# Patient Record
Sex: Female | Born: 1945 | Race: Black or African American | Hispanic: No | Marital: Married | Smoking: Former smoker
Health system: Southern US, Community
[De-identification: ages and names within clinical notes are randomized; demographics above are authoritative.]

## PROBLEM LIST (undated history)

## (undated) DIAGNOSIS — I10 Essential (primary) hypertension: Secondary | ICD-10-CM

## (undated) DIAGNOSIS — E119 Type 2 diabetes mellitus without complications: Secondary | ICD-10-CM

## (undated) HISTORY — PX: CHOLECYSTECTOMY: SHX55

## (undated) HISTORY — PX: STENT PLACEMENT VASCULAR (ARMC HX): HXRAD1737

## (undated) HISTORY — PX: WHIPPLE PROCEDURE: SHX2667

---

## 2019-05-10 ENCOUNTER — Emergency Department: Payer: Medicare Other

## 2019-05-10 ENCOUNTER — Other Ambulatory Visit: Payer: Self-pay

## 2019-05-10 ENCOUNTER — Observation Stay
Admission: EM | Admit: 2019-05-10 | Discharge: 2019-05-11 | Disposition: A | Discharge status: Home / Self Care | Payer: Medicare Other | Attending: Internal Medicine | Admitting: Internal Medicine

## 2019-05-10 ENCOUNTER — Encounter: Payer: Self-pay | Admitting: Emergency Medicine

## 2019-05-10 DIAGNOSIS — Z23 Encounter for immunization: Secondary | ICD-10-CM | POA: Diagnosis not present

## 2019-05-10 DIAGNOSIS — I10 Essential (primary) hypertension: Secondary | ICD-10-CM | POA: Diagnosis not present

## 2019-05-10 DIAGNOSIS — I6523 Occlusion and stenosis of bilateral carotid arteries: Secondary | ICD-10-CM | POA: Insufficient documentation

## 2019-05-10 DIAGNOSIS — E119 Type 2 diabetes mellitus without complications: Secondary | ICD-10-CM

## 2019-05-10 DIAGNOSIS — W19XXXA Unspecified fall, initial encounter: Secondary | ICD-10-CM | POA: Diagnosis not present

## 2019-05-10 DIAGNOSIS — I350 Nonrheumatic aortic (valve) stenosis: Secondary | ICD-10-CM | POA: Diagnosis not present

## 2019-05-10 DIAGNOSIS — E785 Hyperlipidemia, unspecified: Secondary | ICD-10-CM | POA: Diagnosis not present

## 2019-05-10 DIAGNOSIS — K219 Gastro-esophageal reflux disease without esophagitis: Secondary | ICD-10-CM | POA: Insufficient documentation

## 2019-05-10 DIAGNOSIS — Z1159 Encounter for screening for other viral diseases: Secondary | ICD-10-CM | POA: Insufficient documentation

## 2019-05-10 DIAGNOSIS — I351 Nonrheumatic aortic (valve) insufficiency: Secondary | ICD-10-CM | POA: Insufficient documentation

## 2019-05-10 DIAGNOSIS — Z87891 Personal history of nicotine dependence: Secondary | ICD-10-CM | POA: Diagnosis not present

## 2019-05-10 DIAGNOSIS — S01112A Laceration without foreign body of left eyelid and periocular area, initial encounter: Secondary | ICD-10-CM | POA: Diagnosis present

## 2019-05-10 DIAGNOSIS — I951 Orthostatic hypotension: Secondary | ICD-10-CM | POA: Diagnosis not present

## 2019-05-10 DIAGNOSIS — R55 Syncope and collapse: Secondary | ICD-10-CM | POA: Insufficient documentation

## 2019-05-10 HISTORY — DX: Type 2 diabetes mellitus without complications: E11.9

## 2019-05-10 HISTORY — DX: Essential (primary) hypertension: I10

## 2019-05-10 LAB — BASIC METABOLIC PANEL
Anion gap: 10 (ref 5–15)
BUN: 11 mg/dL (ref 8–23)
CO2: 23 mmol/L (ref 22–32)
Calcium: 9.2 mg/dL (ref 8.9–10.3)
Chloride: 97 mmol/L — ABNORMAL LOW (ref 98–111)
Creatinine, Ser: 1.12 mg/dL — ABNORMAL HIGH (ref 0.44–1.00)
GFR calc Af Amer: 56 mL/min — ABNORMAL LOW (ref >60)
GFR calc non Af Amer: 48 mL/min — ABNORMAL LOW (ref >60)
Glucose, Bld: 129 mg/dL — ABNORMAL HIGH (ref 70–99)
Potassium: 4.2 mmol/L (ref 3.5–5.1)
Sodium: 130 mmol/L — ABNORMAL LOW (ref 135–145)

## 2019-05-10 LAB — URINALYSIS, COMPLETE (UACMP) WITH MICROSCOPIC
Bacteria, UA: NONE SEEN
Bilirubin Urine: NEGATIVE
Glucose, UA: NEGATIVE mg/dL
Hgb urine dipstick: NEGATIVE
Ketones, ur: NEGATIVE mg/dL
Leukocytes,Ua: NEGATIVE
Nitrite: NEGATIVE
Protein, ur: NEGATIVE mg/dL
Specific Gravity, Urine: 1.003 — ABNORMAL LOW (ref 1.005–1.030)
Squamous Epithelial / HPF: NONE SEEN (ref 0–5)
pH: 6 (ref 5.0–8.0)

## 2019-05-10 LAB — CBC
HCT: 31.8 % — ABNORMAL LOW (ref 36.0–46.0)
Hemoglobin: 11 g/dL — ABNORMAL LOW (ref 12.0–15.0)
MCH: 28.4 pg (ref 26.0–34.0)
MCHC: 34.6 g/dL (ref 30.0–36.0)
MCV: 82 fL (ref 80.0–100.0)
Platelets: 260 10*3/uL (ref 150–400)
RBC: 3.88 MIL/uL (ref 3.87–5.11)
RDW: 14.6 % (ref 11.5–15.5)
WBC: 6.8 10*3/uL (ref 4.0–10.5)
nRBC: 0 % (ref 0.0–0.2)

## 2019-05-10 LAB — TROPONIN I (HIGH SENSITIVITY)
Troponin I (High Sensitivity): 4 ng/L (ref <18)
Troponin I (High Sensitivity): 6 ng/L (ref <18)

## 2019-05-10 LAB — SARS CORONAVIRUS 2 BY RT PCR (HOSPITAL ORDER, PERFORMED IN ~~LOC~~ HOSPITAL LAB): SARS Coronavirus 2: NEGATIVE

## 2019-05-10 LAB — GLUCOSE, CAPILLARY: Glucose-Capillary: 105 mg/dL — ABNORMAL HIGH (ref 70–99)

## 2019-05-10 MED ORDER — ONDANSETRON HCL 4 MG/2ML IJ SOLN: 4.0000 mg | Freq: Four times a day (QID) | INTRAMUSCULAR | Status: DC | PRN

## 2019-05-10 MED ORDER — INSULIN ASPART 100 UNIT/ML ~~LOC~~ SOLN: 0.0000 [IU] | Freq: Three times a day (TID) | SUBCUTANEOUS | Status: DC

## 2019-05-10 MED ORDER — LIDOCAINE-EPINEPHRINE-TETRACAINE (LET) SOLUTION
3.0000 mL | Freq: Once | NASAL | Status: AC
  Administered 2019-05-10: 3 mL via TOPICAL
  Filled 2019-05-10: qty 3

## 2019-05-10 MED ORDER — SODIUM CHLORIDE 0.9 % IV BOLUS
500.0000 mL | Freq: Once | INTRAVENOUS | Status: AC
  Administered 2019-05-10: 500 mL via INTRAVENOUS

## 2019-05-10 MED ORDER — ACETAMINOPHEN 325 MG PO TABS: 650.0000 mg | ORAL_TABLET | Freq: Four times a day (QID) | ORAL | Status: DC | PRN

## 2019-05-10 MED ORDER — TETANUS-DIPHTH-ACELL PERTUSSIS 5-2.5-18.5 LF-MCG/0.5 IM SUSP
0.5000 mL | Freq: Once | INTRAMUSCULAR | Status: AC
  Administered 2019-05-10: 0.5 mL via INTRAMUSCULAR
  Filled 2019-05-10: qty 0.5

## 2019-05-10 MED ORDER — SODIUM CHLORIDE 0.9 % IV SOLN
INTRAVENOUS | Status: AC
  Administered 2019-05-10: 23:00:00 via INTRAVENOUS

## 2019-05-10 MED ORDER — ONDANSETRON HCL 4 MG PO TABS: 4.0000 mg | ORAL_TABLET | Freq: Four times a day (QID) | ORAL | Status: DC | PRN

## 2019-05-10 MED ORDER — ENOXAPARIN SODIUM 40 MG/0.4ML ~~LOC~~ SOLN
40.0000 mg | SUBCUTANEOUS | Status: DC
  Administered 2019-05-10: 40 mg via SUBCUTANEOUS
  Filled 2019-05-10: qty 0.4

## 2019-05-10 MED ORDER — ACETAMINOPHEN 650 MG RE SUPP: 650.0000 mg | Freq: Four times a day (QID) | RECTAL | Status: DC | PRN

## 2019-05-10 NOTE — ED Notes (Signed)
Patient transported to CT 

## 2019-05-10 NOTE — ED Notes (Signed)
ED TO INPATIENT HANDOFF REPORT  ED Nurse Name and Phone #: Berline Lopesgracie [phone]  S Name/Age/Gender Jenna Robertson 73 y.o. female Room/Bed: ED25A/ED25A  Code Status   Code Status: Not on file  Home/SNF/Other Home Patient oriented to: self, place, time and situation Is this baseline? Yes   Triage Complete: Triage complete  Chief Complaint Fall  Triage Note Pt arrives with concerns over 2 possible syncopal episodes today. Pt arrives with laceration to the left of her eyelid. Pt states she doesn't remember what caused her to fall but states she woke up on the floor.    Allergies Allergies  Allergen Reactions  . Hydromorphone Itching and Hives  . Contrast Media  [Iodinated Diagnostic Agents] Rash and Hives  . Iopamidol Rash and Hives    Level of Care/Admitting Diagnosis ED Disposition    ED Disposition Condition Comment   Admit  Hospital Area: [facility] [[id number]]  Level of Care: Telemetry [5]  Covid Evaluation: Confirmed COVID Negative  Diagnosis: Recurrent syncope [[id number]]  Admitting Physician: Oralia ManisWILLIS, DAVID [[id number]]  Attending Physician: Oralia ManisWILLIS, DAVID [[id number]]  Bed request comments: 2a  PT Class (Do Not Modify): Observation [104]  PT Acc Code (Do Not Modify): Observation [10022]       B Medical/Surgery History Past Medical History:  Diagnosis Date  . Diabetes mellitus without complication (HCC)   . Hypertension    Past Surgical History:  Procedure Laterality Date  . CHOLECYSTECTOMY    . STENT PLACEMENT VASCULAR ([facility] HX)    . WHIPPLE PROCEDURE       A IV Location/Drains/Wounds Patient Lines/Drains/Airways Status   Active Line/Drains/Airways    Name:   Placement date:   Placement time:   Site:   Days:   Peripheral IV 05/10/19 Left Antecubital   05/10/19    2042    Antecubital   less than 1          Intake/Output Last 24 hours No intake or output data in the 24 hours ending 05/10/19 2231  Labs/Imaging Results for orders placed  or performed during the hospital encounter of 05/10/19 (from the past 48 hour(s))  Basic metabolic panel     Status: Abnormal   Collection Time: 05/10/19  6:29 PM  Result Value Ref Range   Sodium 130 (L) 135 - 145 mmol/L   Potassium 4.2 3.5 - 5.1 mmol/L   Chloride 97 (L) 98 - 111 mmol/L   CO2 23 22 - 32 mmol/L   Glucose, Bld 129 (H) 70 - 99 mg/dL   BUN 11 8 - 23 mg/dL   Creatinine, Ser 8.651.12 (H) 0.44 - 1.00 mg/dL   Calcium 9.2 8.9 - 78.410.3 mg/dL   GFR calc non Af Amer 48 (L) >60 mL/min   GFR calc Af Amer 56 (L) >60 mL/min   Anion gap 10 5 - 15    Comment: Performed at [facility], [address] [place], [place] [address]  CBC     Status: Abnormal   Collection Time: 05/10/19  6:29 PM  Result Value Ref Range   WBC 6.8 4.0 - 10.5 K/uL   RBC 3.88 3.87 - 5.11 MIL/uL   Hemoglobin 11.0 (L) 12.0 - 15.0 g/dL   HCT 52.831.8 (L) 41.336.0 - 24.446.0 %   MCV 82.0 80.0 - 100.0 fL   MCH 28.4 26.0 - 34.0 pg   MCHC 34.6 30.0 - 36.0 g/dL   RDW 01.014.6 27.211.5 - 53.615.5 %   Platelets 260 150 - 400 K/uL  nRBC 0.0 0.0 - 0.2 %    Comment: Performed at [facility], [place], [place] [address]  Troponin I (High Sensitivity)     Status: None   Collection Time: 05/10/19  6:29 PM  Result Value Ref Range   Troponin I (High Sensitivity) 4 <18 ng/L    Comment: (NOTE) Elevated high sensitivity troponin I (hsTnI) values and significant  changes across serial measurements may suggest ACS but many other  chronic and acute conditions are known to elevate hsTnI results.  Refer to the "Links" section for chest pain algorithms and additional  guidance. Performed at [facility], [place], [place] [address]   Glucose, capillary     Status: Abnormal   Collection Time: 05/10/19  6:32 PM  Result Value Ref Range   Glucose-Capillary 105 (H) 70 - 99 mg/dL  Troponin I (High Sensitivity)     Status: None   Collection Time: 05/10/19  8:38 PM  Result Value Ref Range    Troponin I (High Sensitivity) 6 <18 ng/L    Comment: (NOTE) Elevated high sensitivity troponin I (hsTnI) values and significant  changes across serial measurements may suggest ACS but many other  chronic and acute conditions are known to elevate hsTnI results.  Refer to the "Links" section for chest pain algorithms and additional  guidance. Performed at [facility], [address] [place], [place] [address]   SARS Coronavirus 2 (CEPHEID - Performed in [facility] hospital lab), Hosp Order     Status: None   Collection Time: 05/10/19  8:53 PM   Specimen: Nasopharyngeal Swab  Result Value Ref Range   SARS Coronavirus 2 NEGATIVE NEGATIVE    Comment: (NOTE) If result is NEGATIVE SARS-CoV-2 target nucleic acids are NOT DETECTED. The SARS-CoV-2 RNA is generally detectable in upper and lower  respiratory specimens during the acute phase of infection. The lowest  concentration of SARS-CoV-2 viral copies this assay can detect is 250  copies / mL. A negative result does not preclude SARS-CoV-2 infection  and should not be used as the sole basis for treatment or other  patient management decisions.  A negative result may occur with  improper specimen collection / handling, submission of specimen other  than nasopharyngeal swab, presence of viral mutation(s) within the  areas targeted by this assay, and inadequate number of viral copies  (<250 copies / mL). A negative result must be combined with clinical  observations, patient history, and epidemiological information. If result is POSITIVE SARS-CoV-2 target nucleic acids are DETECTED. The SARS-CoV-2 RNA is generally detectable in upper and lower  respiratory specimens dur ing the acute phase of infection.  Positive  results are indicative of active infection with SARS-CoV-2.  Clinical  correlation with patient history and other diagnostic information is  necessary to determine patient infection status.  Positive results do  not  rule out bacterial infection or co-infection with other viruses. If result is PRESUMPTIVE POSTIVE SARS-CoV-2 nucleic acids MAY BE PRESENT.   A presumptive positive result was obtained on the submitted specimen  and confirmed on repeat testing.  While 2019 novel coronavirus  (SARS-CoV-2) nucleic acids may be present in the submitted sample  additional confirmatory testing may be necessary for epidemiological  and / or clinical management purposes  to differentiate between  SARS-CoV-2 and other Sarbecovirus currently known to infect humans.  If clinically indicated additional testing with an alternate test  methodology [phone]) is advised. The SARS-CoV-2 RNA is generally  detectable in upper and lower respiratory sp ecimens during the acute  phase of infection. The expected result is Negative. Fact Sheet for Patients:  BoilerBrush.com.cyhttps://www.fda.gov/media/136312/download Fact Sheet for Healthcare Providers: https://pope.com/https://www.fda.gov/media/136313/download This test is not yet approved or cleared by the [place] FDA and has been authorized for detection and/or diagnosis of SARS-CoV-2 by FDA under an Emergency Use Authorization (EUA).  This EUA will remain in effect (meaning this test can be used) for the duration of the COVID-19 declaration under Section 564(b)(1) of the Act, 21 U.S.C. section 360bbb-3(b)(1), unless the authorization is terminated or revoked sooner. Performed at [facility], [address] [place], [place] [address]   Urinalysis, Complete w Microscopic     Status: Abnormal   Collection Time: 05/10/19 10:09 PM  Result Value Ref Range   Color, Urine STRAW (A) YELLOW   APPearance CLEAR (A) CLEAR   Specific Gravity, Urine 1.003 (L) 1.005 - 1.030   pH 6.0 5.0 - 8.0   Glucose, UA NEGATIVE NEGATIVE mg/dL   Hgb urine dipstick NEGATIVE NEGATIVE   Bilirubin Urine NEGATIVE NEGATIVE   Ketones, ur NEGATIVE NEGATIVE mg/dL   Protein, ur NEGATIVE NEGATIVE mg/dL   Nitrite  NEGATIVE NEGATIVE   Leukocytes,Ua NEGATIVE NEGATIVE   RBC / HPF 0-5 0 - 5 RBC/hpf   WBC, UA 0-5 0 - 5 WBC/hpf   Bacteria, UA NONE SEEN NONE SEEN   Squamous Epithelial / LPF NONE SEEN 0 - 5    Comment: Performed at [facility], [address] [place], [place] [address]   Ct Head Wo Contrast  Result Date: 05/10/2019 CLINICAL DATA:  Head trauma EXAM: CT HEAD WITHOUT CONTRAST TECHNIQUE: Contiguous axial images were obtained from the base of the skull through the vertex without intravenous contrast. COMPARISON:  None. FINDINGS: Brain: No evidence of acute infarction, hemorrhage, hydrocephalus, extra-axial collection or mass lesion/mass effect. Mild periventricular white matter hypodensity. Vascular: No hyperdense vessel or unexpected calcification. Skull: Normal. Negative for fracture or focal lesion. Sinuses/Orbits: No acute finding. Other: None. IMPRESSION: No acute intracranial pathology.  Small-vessel white matter disease. Electronically Signed   By: Lauralyn PrimesAlex  Bibbey M.D.   On: 05/10/2019 18:49   Ct Cervical Spine Wo Contrast  Result Date: 05/10/2019 CLINICAL DATA:  Fall.  Syncope. EXAM: CT CERVICAL SPINE WITHOUT CONTRAST TECHNIQUE: Multidetector CT imaging of the cervical spine was performed without intravenous contrast. Multiplanar CT image reconstructions were also generated. COMPARISON:  None. FINDINGS: Alignment: Mild anterolisthesis C7-T1 Skull base and vertebrae: Negative for cervical spine fracture Soft tissues and spinal canal: Negative for soft tissue mass or edema. Disc levels: Moderate disc degeneration and spurring throughout the cervical spine. Prominent right-sided osteophyte at C3-4 causing spinal stenosis. Mild spinal stenosis C4-5 and C5-6 and C6-7. Upper chest: Lung apices clear bilaterally Other: None IMPRESSION: Negative for cervical spine fracture. Moderate cervical spondylosis. Electronically Signed   By: Marlan Palauharles  Clark M.D.   On: 05/10/2019 20:54   Dg Chest Portable 1  View  Result Date: 05/10/2019 CLINICAL DATA:  Syncope. EXAM: PORTABLE CHEST 1 VIEW COMPARISON:  None. FINDINGS: The heart size is mildly enlarged. There are aortic calcifications. There is no pneumothorax. No large pleural effusion. There are prominent interstitial lung markings. There is no large pleural effusion. IMPRESSION: 1. Mild cardiac enlargement. 2. Prominent interstitial lung markings favored to represent mild volume overload on a background of emphysematous changes. Electronically Signed   By: Katherine Mantlehristopher  Green M.D.   On: 05/10/2019 20:37    Pending Labs [employer] Labs (  From admission, onward)    Start     Ordered   Signed and Held  CBC  (enoxaparin (LOVENOX)    CrCl >/= 30 ml/min)  Once,   R    Comments: Baseline for enoxaparin therapy IF NOT ALREADY DRAWN.  Notify MD if PLT < 100 K.    Signed and Held   Signed and Held  Creatinine, serum  (enoxaparin (LOVENOX)    CrCl >/= 30 ml/min)  Once,   R    Comments: Baseline for enoxaparin therapy IF NOT ALREADY DRAWN.    Signed and Held   Signed and Held  Creatinine, serum  (enoxaparin (LOVENOX)    CrCl >/= 30 ml/min)  Weekly,   R    Comments: while on enoxaparin therapy    Signed and Held   Signed and Held  Basic metabolic panel  Tomorrow morning,   R     Signed and Held   Signed and Held  CBC  Tomorrow morning,   R     Signed and Held          Vitals/Pain Today's Vitals   05/10/19 2030 05/10/19 2100 05/10/19 2130 05/10/19 2200  BP: 140/78 137/82 129/78 133/76  Pulse: 99 95 94 87  Resp: 18 13 (!) 22 14  Temp:      TempSrc:      SpO2: 99% 100% 100% 100%  Weight:      Height:      PainSc:        Isolation Precautions No active isolations  Medications Medications  lidocaine-EPINEPHrine-tetracaine (LET) solution (3 mLs Topical Given by Other 05/10/19 2131)  Tdap (BOOSTRIX) injection 0.5 mL (0.5 mLs Intramuscular Given 05/10/19 2022)  sodium chloride 0.9 % bolus 500 mL (500 mLs Intravenous Bolus 05/10/19 2219)     Mobility walks Moderate fall risk   Focused Assessments Neuro Assessment Handoff:  Swallow screen pass? n/a         Neuro Assessment: Within Defined Limits Neuro Checks:      Last Documented NIHSS Modified Score:   Has TPA been given? No If patient is a Neuro Trauma and patient is going to OR before floor call report to 4N Charge nurse: [phone] or [phone]     R Recommendations: See Admitting Provider Note  Report given to:   Additional Notes:

## 2019-05-10 NOTE — ED Notes (Signed)
pts son updated at this time

## 2019-05-10 NOTE — ED Triage Notes (Signed)
Pt arrives with concerns over 2 possible syncopal episodes today. Pt arrives with laceration to the left of her eyelid. Pt states she doesn't remember what caused her to fall but states she woke up on the floor.

## 2019-05-10 NOTE — ED Notes (Signed)
Pt given crackers and peanut butter and diet cole at this time. Pt ambulated to bathroom with assistance, minimal dizziness. Pt placed top dentures in pink cup, placed in pts black purse at bedside.

## 2019-05-10 NOTE — ED Provider Notes (Signed)
[facility] Emergency Department Provider Note    First MD Initiated Contact with Patient 05/10/19 2003     (approximate)  I have reviewed the triage vital signs and the nursing notes.   HISTORY  Chief Complaint Loss of Consciousness    HPI Jenna Robertson is a 73 y.o. female presents the ER for evaluation of head injury as well as multiple falls and syncopal events over the past several days.  Patient is amnestic to the events.  States that she wakes up on the floor.  States she had 2 falls today.  Denies any palpitations chest pain or flashing lights before.  Denies any shortness of breath.  Denies any history of syncope or fainting spells.  Denies any neck pain no numbness or tingling.    Past Medical History:  Diagnosis Date  . Diabetes mellitus without complication (HCC)   . Hypertension    No family history on file.  There are no active problems to display for this patient.     Prior to Admission medications   Not on File    Allergies Patient has no allergy information on record.    Social History Social History   Tobacco Use  . Smoking status: Not on file  Substance Use Topics  . Alcohol use: Not on file  . Drug use: Not on file    Review of Systems Patient denies headaches, rhinorrhea, blurry vision, numbness, shortness of breath, chest pain, edema, cough, abdominal pain, nausea, vomiting, diarrhea, dysuria, fevers, rashes or hallucinations unless otherwise stated above in HPI. ____________________________________________   PHYSICAL EXAM:  VITAL SIGNS: Vitals:   05/10/19 1825  BP: 131/75  Pulse: (!) 113  Resp: 16  Temp: 98.8 F (37.1 C)  SpO2: 98%    Constitutional: Alert and oriented.  Eyes: Conjunctivae are normal. EOMI Head: 1cm left eyebrow laceration with associated swelling, no proptosis Nose: No congestion/rhinnorhea. Mouth/Throat: Mucous membranes are moist.   Neck: No stridor. Painless ROM.   Cardiovascular: Normal rate, regular rhythm. Grossly normal heart sounds.  Good peripheral circulation. Respiratory: Normal respiratory effort.  No retractions. Lungs CTAB. Gastrointestinal: Soft and nontender. No distention. No abdominal bruits. No CVA tenderness. Genitourinary: deferred Musculoskeletal: No lower extremity tenderness nor edema.  No joint effusions. Neurologic: CN- intact.  No facial droop, Normal FNF.  Normal heel to shin.  Sensation intact bilaterally. Normal speech and language. No gross focal neurologic deficits are appreciated. No gait instability. Skin:  Skin is warm, dry and intact. No rash noted. Laceration as above Psychiatric: Mood and affect are normal. Speech and behavior are normal.  ____________________________________________   LABS (all labs ordered are listed, but only abnormal results are displayed)  Results for orders placed or performed during the hospital encounter of 05/10/19 (from the past 24 hour(s))  Basic metabolic panel     Status: Abnormal   Collection Time: 05/10/19  6:29 PM  Result Value Ref Range   Sodium 130 (L) 135 - 145 mmol/L   Potassium 4.2 3.5 - 5.1 mmol/L   Chloride 97 (L) 98 - 111 mmol/L   CO2 23 22 - 32 mmol/L   Glucose, Bld 129 (H) 70 - 99 mg/dL   BUN 11 8 - 23 mg/dL   Creatinine, Ser 1.611.12 (H) 0.44 - 1.00 mg/dL   Calcium 9.2 8.9 - 09.610.3 mg/dL   GFR calc non Af Amer 48 (L) >60 mL/min   GFR calc Af Amer 56 (L) >60 mL/min   Anion gap 10 5 -  15  CBC     Status: Abnormal   Collection Time: 05/10/19  6:29 PM  Result Value Ref Range   WBC 6.8 4.0 - 10.5 K/uL   RBC 3.88 3.87 - 5.11 MIL/uL   Hemoglobin 11.0 (L) 12.0 - 15.0 g/dL   HCT 40.931.8 (L) 81.136.0 - 91.446.0 %   MCV 82.0 80.0 - 100.0 fL   MCH 28.4 26.0 - 34.0 pg   MCHC 34.6 30.0 - 36.0 g/dL   RDW 78.214.6 95.611.5 - 21.315.5 %   Platelets 260 150 - 400 K/uL   nRBC 0.0 0.0 - 0.2 %  Troponin I (High Sensitivity)     Status: None   Collection Time: 05/10/19  6:29 PM  Result Value Ref Range    Troponin I (High Sensitivity) 4 <18 ng/L  Glucose, capillary     Status: Abnormal   Collection Time: 05/10/19  6:32 PM  Result Value Ref Range   Glucose-Capillary 105 (H) 70 - 99 mg/dL  Troponin I (High Sensitivity)     Status: None   Collection Time: 05/10/19  8:38 PM  Result Value Ref Range   Troponin I (High Sensitivity) 6 <18 ng/L   ____________________________________________  EKG My review and personal interpretation at Time: 18:21   Indication: syncope  Rate: 115  Rhythm: sinus Axis: left Other: nonspecific st and t wave abn, no stemi ____________________________________________  RADIOLOGY  I personally reviewed all radiographic images ordered to evaluate for the above acute complaints and reviewed radiology reports and findings.  These findings were personally discussed with the patient.  Please see medical record for radiology report.  ____________________________________________   PROCEDURES  Procedure(s) performed:  Marland Kitchen.Marland Kitchen.Laceration Repair  Date/Time: 05/10/2019 9:34 PM Performed by: Willy Eddyobinson, Laylee Schooley, MD Authorized by: Willy Eddyobinson, Fiona Coto, MD   Consent:    Consent obtained:  Verbal   Consent given by:  Patient   Risks discussed:  Infection, pain, retained foreign body, poor cosmetic result and poor wound healing Anesthesia (see MAR for exact dosages):    Anesthesia method:  Topical application   Topical anesthetic:  LET Laceration details:    Location:  Face   Face location:  L eyebrow   Length (cm):  1   Depth (mm):  2 Repair type:    Repair type:  Simple Exploration:    Hemostasis achieved with:  Direct pressure   Wound exploration: entire depth of wound probed and visualized     Contaminated: no   Treatment:    Area cleansed with:  Saline and Betadine   Amount of cleaning:  Extensive   Irrigation solution:  Sterile saline   Visualized foreign bodies/material removed: no   Skin repair:    Repair method:  Sutures   Suture size:  5-0   Suture  technique:  Simple interrupted   Number of sutures:  2 Approximation:    Approximation:  Close Post-procedure details:    Dressing:  Sterile dressing   Patient tolerance of procedure:  Tolerated well, no immediate complications      Critical Care performed: no ____________________________________________   INITIAL IMPRESSION / ASSESSMENT AND PLAN / ED COURSE  Pertinent labs & imaging results that were available during my care of the patient were reviewed by me and considered in my medical decision making (see chart for details).   DDX: Dehydration, orthostasis, dysrhythmia, CHF, ACS, subdural, epidural, laceration, fracture  Jenna Robertson is a 73 y.o. who presents to the ED with multiple syncopal episodes.  Patient with evidence of injury to  the head.  CT imaging of the head and neck without any acute intracranial abnormality.  Does not have any focal neuro deficits.  Will give IV hydration as may have a component of orthostasis but given her age mild tachycardia and risk factors do feel patient will require observation in the hospital evaluation of cardiac syncope is there is no other explanation for her symptoms thus far.     The patient was evaluated in Emergency Department today for the symptoms described in the history of present illness. He/she was evaluated in the context of the global COVID-19 pandemic, which necessitated consideration that the patient might be at risk for infection with the SARS-CoV-2 virus that causes COVID-19. Institutional protocols and algorithms that pertain to the evaluation of patients at risk for COVID-19 are in a state of rapid change based on information released by regulatory bodies including the CDC and federal and state organizations. These policies and algorithms were followed during the patient's care in the ED.  As part of my medical decision making, I reviewed the following data within the [place] notes reviewed and  incorporated, Labs reviewed, notes from prior ED visits and [place] Controlled Substance Database   ____________________________________________   FINAL CLINICAL IMPRESSION(S) / ED DIAGNOSES  Final diagnoses:  Syncope, unspecified syncope type  Laceration of left eyebrow, initial encounter      NEW MEDICATIONS STARTED DURING THIS VISIT:  New Prescriptions   No medications on file     Note:  This document was prepared using Dragon voice recognition software and may include unintentional dictation errors.    Merlyn Lot, MD 05/10/19 2136

## 2019-05-10 NOTE — H&P (Addendum)
[facility] [place] [facility]   PATIENT NAME: Jenna PotterDoris Robertson    MR#:  [id number]  DATE OF BIRTH:  1946/10/22  DATE OF ADMISSION:  05/10/2019  PRIMARY CARE PHYSICIAN: Terrance MassVelez, Ramon, MD   REQUESTING/REFERRING PHYSICIAN: Roxan Hockeyobinson, MD  CHIEF COMPLAINT:   Chief Complaint  Patient presents with  . Loss of Consciousness    HISTORY OF PRESENT ILLNESS:  Jenna Robertson  is a 73 y.o. female who presents with chief complaint as above.  Patient presents to the ED after 2 syncopal episodes today.  She states that she has been having syncopal episodes for quite some time now, greater than a year.  She states that she "told her doctor about this", but cannot clarify any work-up that may have been performed.  Work-up here in the ED is largely within normal limits except for some mild hyponatremia.  Hospitalist were called for admission and further evaluation  PAST MEDICAL HISTORY:   Past Medical History:  Diagnosis Date  . Diabetes mellitus without complication (HCC)   . Hypertension      PAST SURGICAL HISTORY:   Past Surgical History:  Procedure Laterality Date  . CHOLECYSTECTOMY    . STENT PLACEMENT VASCULAR ([facility] HX)    . WHIPPLE PROCEDURE       SOCIAL HISTORY:   Social History   Tobacco Use  . Smoking status: Former Smoker  Substance Use Topics  . Alcohol use: Not Currently     FAMILY HISTORY:   Family History  Problem Relation Age of Onset  . Diabetes Mother   . Hypertension Mother   . Hypertension Father   . Hypertension Brother   . Aneurysm Brother   . Aneurysm Sister      DRUG ALLERGIES:   Allergies  Allergen Reactions  . Hydromorphone Itching and Hives  . Contrast Media  [Iodinated Diagnostic Agents] Rash and Hives  . Iopamidol Rash and Hives    MEDICATIONS AT HOME:   Prior to Admission medications   Not on File    REVIEW OF SYSTEMS:  Review of Systems  Constitutional: Negative for chills, fever, malaise/fatigue and  weight loss.  HENT: Negative for ear pain, hearing loss and tinnitus.   Eyes: Negative for blurred vision, double vision, pain and redness.  Respiratory: Negative for cough, hemoptysis and shortness of breath.   Cardiovascular: Negative for chest pain, palpitations, orthopnea and leg swelling.  Gastrointestinal: Negative for abdominal pain, constipation, diarrhea, nausea and vomiting.  Genitourinary: Negative for dysuria, frequency and hematuria.  Musculoskeletal: Negative for back pain, joint pain and neck pain.  Skin:       No acne, rash, or lesions  Neurological: Positive for loss of consciousness. Negative for dizziness, tremors, focal weakness and weakness.  Endo/Heme/Allergies: Negative for polydipsia. Does not bruise/bleed easily.  Psychiatric/Behavioral: Negative for depression. The patient is not nervous/anxious and does not have insomnia.      VITAL SIGNS:   Vitals:   05/10/19 2030 05/10/19 2100 05/10/19 2130 05/10/19 2200  BP: 140/78 137/82 129/78 133/76  Pulse: 99 95 94 87  Resp: 18 13 (!) 22 14  Temp:      TempSrc:      SpO2: 99% 100% 100% 100%  Weight:      Height:       Wt Readings from Last 3 Encounters:  05/10/19 55.8 kg    PHYSICAL EXAMINATION:  Physical Exam  Vitals reviewed. Constitutional: She is oriented to person, place, and time. She appears well-developed and  well-nourished. No distress.  HENT:  Head: Normocephalic and atraumatic.  Mouth/Throat: Oropharynx is clear and moist.  Eyes: Pupils are equal, round, and reactive to light. Conjunctivae and EOM are normal. No scleral icterus.  Neck: Normal range of motion. Neck supple. No JVD present. No thyromegaly present.  Cardiovascular: Normal rate, regular rhythm and intact distal pulses. Exam reveals no gallop and no friction rub.  Murmur (Prominent systolic murmur over the aortic region) heard. Respiratory: Effort normal and breath sounds normal. No respiratory distress. She has no wheezes. She has no  rales.  GI: Soft. Bowel sounds are normal. She exhibits no distension. There is no abdominal tenderness.  Musculoskeletal: Normal range of motion.        General: No edema.     Comments: No arthritis, no gout  Lymphadenopathy:    She has no cervical adenopathy.  Neurological: She is alert and oriented to person, place, and time. No cranial nerve deficit.  No dysarthria, no aphasia  Skin: Skin is warm and dry. No rash noted. No erythema.  Psychiatric: She has a normal mood and affect. Her behavior is normal. Judgment and thought content normal.    LABORATORY PANEL:   CBC Recent Labs  Lab 05/10/19 1829  WBC 6.8  HGB 11.0*  HCT 31.8*  PLT 260   ------------------------------------------------------------------------------------------------------------------  Chemistries  Recent Labs  Lab 05/10/19 1829  NA 130*  K 4.2  CL 97*  CO2 23  GLUCOSE 129*  BUN 11  CREATININE 1.12*  CALCIUM 9.2   ------------------------------------------------------------------------------------------------------------------  Cardiac Enzymes No results for input(s): TROPONINI in the last 168 hours. ------------------------------------------------------------------------------------------------------------------  RADIOLOGY:  Ct Head Wo Contrast  Result Date: 05/10/2019 CLINICAL DATA:  Head trauma EXAM: CT HEAD WITHOUT CONTRAST TECHNIQUE: Contiguous axial images were obtained from the base of the skull through the vertex without intravenous contrast. COMPARISON:  None. FINDINGS: Brain: No evidence of acute infarction, hemorrhage, hydrocephalus, extra-axial collection or mass lesion/mass effect. Mild periventricular white matter hypodensity. Vascular: No hyperdense vessel or unexpected calcification. Skull: Normal. Negative for fracture or focal lesion. Sinuses/Orbits: No acute finding. Other: None. IMPRESSION: No acute intracranial pathology.  Small-vessel white matter disease. Electronically Signed    By: Lauralyn PrimesAlex  Bibbey M.D.   On: 05/10/2019 18:49   Ct Cervical Spine Wo Contrast  Result Date: 05/10/2019 CLINICAL DATA:  Fall.  Syncope. EXAM: CT CERVICAL SPINE WITHOUT CONTRAST TECHNIQUE: Multidetector CT imaging of the cervical spine was performed without intravenous contrast. Multiplanar CT image reconstructions were also generated. COMPARISON:  None. FINDINGS: Alignment: Mild anterolisthesis C7-T1 Skull base and vertebrae: Negative for cervical spine fracture Soft tissues and spinal canal: Negative for soft tissue mass or edema. Disc levels: Moderate disc degeneration and spurring throughout the cervical spine. Prominent right-sided osteophyte at C3-4 causing spinal stenosis. Mild spinal stenosis C4-5 and C5-6 and C6-7. Upper chest: Lung apices clear bilaterally Other: None IMPRESSION: Negative for cervical spine fracture. Moderate cervical spondylosis. Electronically Signed   By: Marlan Palauharles  Clark M.D.   On: 05/10/2019 20:54   Dg Chest Portable 1 View  Result Date: 05/10/2019 CLINICAL DATA:  Syncope. EXAM: PORTABLE CHEST 1 VIEW COMPARISON:  None. FINDINGS: The heart size is mildly enlarged. There are aortic calcifications. There is no pneumothorax. No large pleural effusion. There are prominent interstitial lung markings. There is no large pleural effusion. IMPRESSION: 1. Mild cardiac enlargement. 2. Prominent interstitial lung markings favored to represent mild volume overload on a background of emphysematous changes. Electronically Signed   By: Beryle Quanthristopher  Green M.D.  On: 05/10/2019 20:37    EKG:   Orders placed or performed during the hospital encounter of 05/10/19  . EKG 12-Lead  . EKG 12-Lead  . ED EKG  . ED EKG    IMPRESSION AND PLAN:  Principal Problem:   Recurrent syncope -given her prominent aortic murmur, suspect aortic stenosis may be contributing here.  Echo in care everywhere from 2012 showed aortic sclerosis, without significant stenosis at that time.  Admit to telemetry, get  echocardiogram and cardiology consult, gentle IV fluids tonight Active Problems:   Diabetes (HCC) -sliding scale insulin coverage   HTN (hypertension) -home dose antihypertensives   HLD (hyperlipidemia) -Home dose antilipid  Chart review performed and case discussed with ED provider. Labs, imaging and/or ECG reviewed by provider and discussed with patient/family. Management plans discussed with the patient and/or family.  COVID-19 status: Tested negative     DVT PROPHYLAXIS: SubQ lovenox   GI PROPHYLAXIS:  None  ADMISSION STATUS: Observation  CODE STATUS: Full  TOTAL TIME TAKING CARE OF THIS PATIENT: 40 minutes.   This patient was evaluated in the context of the global COVID-19 pandemic, which necessitated consideration that the patient might be at risk for infection with the SARS-CoV-2 virus that causes COVID-19. Institutional protocols and algorithms that pertain to the evaluation of patients at risk for COVID-19 are in a state of rapid change based on information released by regulatory bodies including the CDC and federal and state organizations. These policies and algorithms were followed to the best of this provider's knowledge to date during the patient's care at this facility.  Ethlyn Daniels 05/10/2019, 10:13 PM  [facility]  Office  [phone]  CC: Primary care physician; Orlie Pollen, MD  Note:  This document was prepared using Dragon voice recognition software and may include unintentional dictation errors.

## 2019-05-11 ENCOUNTER — Observation Stay
Admit: 2019-05-11 | Discharge: 2019-05-11 | Disposition: A | Discharge status: Home / Self Care | Payer: Medicare Other | Attending: Internal Medicine | Admitting: Internal Medicine

## 2019-05-11 ENCOUNTER — Observation Stay: Payer: Medicare Other

## 2019-05-11 LAB — GLUCOSE, CAPILLARY
Glucose-Capillary: 97 mg/dL (ref 70–99)
Glucose-Capillary: 108 mg/dL — ABNORMAL HIGH (ref 70–99)

## 2019-05-11 LAB — ECHOCARDIOGRAM COMPLETE
Height: 61 in
Weight: 2020.8 oz

## 2019-05-11 LAB — CBC
HCT: 31.3 % — ABNORMAL LOW (ref 36.0–46.0)
Hemoglobin: 10.5 g/dL — ABNORMAL LOW (ref 12.0–15.0)
MCH: 28.1 pg (ref 26.0–34.0)
MCHC: 33.5 g/dL (ref 30.0–36.0)
MCV: 83.7 fL (ref 80.0–100.0)
Platelets: 224 10*3/uL (ref 150–400)
RBC: 3.74 MIL/uL — ABNORMAL LOW (ref 3.87–5.11)
RDW: 14.6 % (ref 11.5–15.5)
WBC: 5 10*3/uL (ref 4.0–10.5)
nRBC: 0 % (ref 0.0–0.2)

## 2019-05-11 LAB — BASIC METABOLIC PANEL
Anion gap: 5 (ref 5–15)
BUN: 9 mg/dL (ref 8–23)
CO2: 28 mmol/L (ref 22–32)
Calcium: 8.6 mg/dL — ABNORMAL LOW (ref 8.9–10.3)
Chloride: 105 mmol/L (ref 98–111)
Creatinine, Ser: 0.97 mg/dL (ref 0.44–1.00)
GFR calc Af Amer: >60 mL/min (ref >60)
GFR calc non Af Amer: 58 mL/min — ABNORMAL LOW (ref >60)
Glucose, Bld: 99 mg/dL (ref 70–99)
Potassium: 3.9 mmol/L (ref 3.5–5.1)
Sodium: 138 mmol/L (ref 135–145)

## 2019-05-11 MED ORDER — MIDODRINE HCL 5 MG PO TABS: 5.0000 mg | ORAL_TABLET | Freq: Three times a day (TID) | ORAL | Status: DC

## 2019-05-11 MED ORDER — ATORVASTATIN CALCIUM 20 MG PO TABS: 40.0000 mg | ORAL_TABLET | Freq: Every day | ORAL | Status: DC

## 2019-05-11 MED ORDER — ADULT MULTIVITAMIN W/MINERALS CH: 1.0000 | ORAL_TABLET | Freq: Every day | ORAL | Status: DC

## 2019-05-11 MED ORDER — MIDODRINE HCL 5 MG PO TABS: 5.0000 mg | ORAL_TABLET | Freq: Three times a day (TID) | ORAL | 0 refills | Status: AC

## 2019-05-11 MED ORDER — CLOPIDOGREL BISULFATE 75 MG PO TABS
75.0000 mg | ORAL_TABLET | Freq: Every day | ORAL | Status: DC
  Administered 2019-05-11: 75 mg via ORAL
  Filled 2019-05-11: qty 1

## 2019-05-11 MED ORDER — LISINOPRIL 5 MG PO TABS
5.0000 mg | ORAL_TABLET | Freq: Every day | ORAL | Status: DC
  Administered 2019-05-11: 5 mg via ORAL
  Filled 2019-05-11: qty 1

## 2019-05-11 MED ORDER — ENSURE ENLIVE PO LIQD: 237.0000 mL | Freq: Two times a day (BID) | ORAL | Status: DC

## 2019-05-11 NOTE — Progress Notes (Signed)
Initial Nutrition Assessment  DOCUMENTATION CODES:   Not applicable  INTERVENTION:   Ensure Enlive po BID, each supplement provides 350 kcal and 20 grams of protein  MVI daily   NUTRITION DIAGNOSIS:   Inadequate oral intake related to acute illness as evidenced by other (comment)(per chart review).  GOAL:   Patient will meet greater than or equal to 90% of their needs  MONITOR:   PO intake, Supplement acceptance, Labs, Weight trends, Skin, I & O's  REASON FOR ASSESSMENT:   Malnutrition Screening Tool    ASSESSMENT:   73 y.o. with h/o PVD and DM presents to the ED with multiple syncopal episodes.  RD working remotely.  Pt with fair appetite and oral intake in hospital. RD will add supplements and MVI to help pt meet her estimated needs. Per chart, pt appears fairly weight stable pta.    Medications reviewed and include: plavix, lovenox, insulin   Labs reviewed: Hgb 10.5(L), Hct 31.3(L)  Unable to complete Nutrition-Focused physical exam at this time.   Diet Order:   Diet Order            Diet regular Room service appropriate? Yes; Fluid consistency: Thin  Diet effective now             EDUCATION NEEDS:   No education needs have been identified at this time  Skin:  Skin Assessment: Reviewed RN Assessment(abrasions, eye laceration)  Last BM:  6/29  Height:   Ht Readings from Last 1 Encounters:  05/10/19 5\' 1"  (1.549 m)    Weight:   Wt Readings from Last 1 Encounters:  05/10/19 57.3 kg    Ideal Body Weight:  47.7 kg  BMI:  Body mass index is 23.86 kg/m.  Estimated Nutritional Needs:   Kcal:  1300-1500kcal/day  Protein:  65-75g/day  Fluid:  >1.2L/day  Koleen Distance MS, RD, LDN Pager #- [phone] Office#- [phone] After Hours Pager: [phone]

## 2019-05-11 NOTE — Progress Notes (Signed)
*  PRELIMINARY RESULTS* Echocardiogram 2D Echocardiogram has been performed.  Sherrie Sport 05/11/2019, 10:54 AM

## 2019-05-11 NOTE — Progress Notes (Signed)
Advanced care plan.  Purpose of the Encounter: CODE STATUS  Parties in Attendance: Patient herself  Patient's Decision Capacity: Intact  Subjective/Patient's story:  Patient 73 year old with history of recurrent syncope, history of hypertension diabetes presents with recurrent syncope.  Has had these symptoms for a year now.  She was evaluated in the ED and further evaluation showed that she has severe orthostatic hypotension.   Objective/Medical story I discussed with the patient regarding her desires for cardiac and pulmonary resuscitation.  Also recommended a living will and healthcare power of attorney.   Goals of care determination:  Patient states that she wants to be a full code She will discuss with her family regarding living will and healthcare power of attorney  CODE STATUS:  Full code  Time spent discussing advanced care planning: 16 minutes

## 2019-05-11 NOTE — Progress Notes (Signed)
Patient stated her son called and found bottom dentures. IV and tele removed. Discharge instructions given to patient along with hard copy prescription. Verbalized understanding. Son will transport patient home.

## 2019-05-11 NOTE — ED Notes (Signed)
Called from 2A looking for lower dentures.  Cyndi Bender, RN called and asked about lower dentures.  States that patient did not have lower dentures with her because when patient wanted something to eat, patient stated "just crackers because I do not have my lower dentures with me".  Spoke with Serenity, RN from 2A and informed her.

## 2019-05-11 NOTE — Discharge Summary (Signed)
[employer] - [place] at [facility]  Jenna Robertson, 73 y.o., DOB 1946-04-04, MRN [id number]. Admission date: 05/10/2019 Discharge Date 05/11/2019 Primary MD Terrance MassVelez, Ramon, MD Admitting Physician Oralia Manisavid Willis, MD  Admission Diagnosis  Laceration of left eyebrow, initial encounter [S01.112A] Syncope, unspecified syncope type [R55]  Discharge Diagnosis   Principal Problem: Recurrent syncope due to severe orthostatic hypotension Diabetes (HCC) HTN (hypertension) HLD (hyperlipidemia) GERD (gastroesophageal reflux disease)        Hospital Course   Jenna Robertson  is a 73 y.o. female who presents with chief complaint as above.  Patient presents to the ED after 2 syncopal episodes today.  She states that she has been having syncopal episodes for quite some time now.  Patient was admitted to the hospital further work-up was done including orthostatics check patient is severely orthostatic.  Her echocardiogram showed no significant abnormality except age-related valvular changes.  Carotid Dopplers were okay.  Patient's symptoms are due to orthostatic hypotension.  I have discontinued her blood pressure medications.  I have also started him on TED hose.  I also started patient on midodrine she will need to follow-up with her primary care provider.           Consults  None  Significant Tests:  See full reports for all details     Ct Head Wo Contrast  Result Date: 05/10/2019 CLINICAL DATA:  Head trauma EXAM: CT HEAD WITHOUT CONTRAST TECHNIQUE: Contiguous axial images were obtained from the base of the skull through the vertex without intravenous contrast. COMPARISON:  None. FINDINGS: Brain: No evidence of acute infarction, hemorrhage, hydrocephalus, extra-axial collection or mass lesion/mass effect. Mild periventricular white matter hypodensity. Vascular: No hyperdense vessel or unexpected calcification. Skull: Normal. Negative for fracture or focal lesion. Sinuses/Orbits: No  acute finding. Other: None. IMPRESSION: No acute intracranial pathology.  Small-vessel white matter disease. Electronically Signed   By: Lauralyn PrimesAlex  Bibbey M.D.   On: 05/10/2019 18:49   Ct Cervical Spine Wo Contrast  Result Date: 05/10/2019 CLINICAL DATA:  Fall.  Syncope. EXAM: CT CERVICAL SPINE WITHOUT CONTRAST TECHNIQUE: Multidetector CT imaging of the cervical spine was performed without intravenous contrast. Multiplanar CT image reconstructions were also generated. COMPARISON:  None. FINDINGS: Alignment: Mild anterolisthesis C7-T1 Skull base and vertebrae: Negative for cervical spine fracture Soft tissues and spinal canal: Negative for soft tissue mass or edema. Disc levels: Moderate disc degeneration and spurring throughout the cervical spine. Prominent right-sided osteophyte at C3-4 causing spinal stenosis. Mild spinal stenosis C4-5 and C5-6 and C6-7. Upper chest: Lung apices clear bilaterally Other: None IMPRESSION: Negative for cervical spine fracture. Moderate cervical spondylosis. Electronically Signed   By: Marlan Palauharles  Clark M.D.   On: 05/10/2019 20:54   [place] Carotid Bilateral  Result Date: 05/11/2019 CLINICAL DATA:  73 year old female with syncope EXAM: BILATERAL CAROTID DUPLEX ULTRASOUND TECHNIQUE: Wallace CullensGray scale imaging, color Doppler and duplex ultrasound were performed of bilateral carotid and vertebral arteries in the neck. COMPARISON:  None. FINDINGS: Criteria: Quantification of carotid stenosis is based on velocity parameters that correlate the residual internal carotid diameter with NASCET-based stenosis levels, using the diameter of the distal internal carotid lumen as the denominator for stenosis measurement. The following velocity measurements were obtained: RIGHT ICA: 70/17 cm/sec CCA: 70/11 cm/sec SYSTOLIC ICA/CCA RATIO: 1.0 ECA:  77 cm/sec LEFT ICA: 102/24 cm/sec CCA: 68/11 cm/sec SYSTOLIC ICA/CCA RATIO: 1.5 ECA:  76 cm/sec RIGHT CAROTID ARTERY: Heterogeneous atherosclerotic plaque in the carotid  bifurcation and proximal internal carotid artery. By peak systolic velocity  criteria, the estimated stenosis remains less than 50%. RIGHT VERTEBRAL ARTERY:  Patent with normal antegrade flow. LEFT CAROTID ARTERY: Heterogeneous atherosclerotic plaque in the proximal internal carotid artery. Proximal and mid internal carotid artery are also highly tortuous. By peak systolic velocity criteria, the estimated stenosis remains less than 50%. LEFT VERTEBRAL ARTERY:  Patent with normal antegrade flow. IMPRESSION: 1. Mild (1-49%) stenosis proximal right internal carotid artery secondary to heterogenous atherosclerotic plaque. 2. Mild (1-49%) stenosis proximal left internal carotid artery secondary to heterogenous atherosclerotic plaque. 3. The vertebral arteries are patent with normal antegrade flow. Signed, Sterling BigHeath K. McCullough, MD, RPVI [facility] Electronically Signed   By: Malachy MoanHeath  McCullough M.D.   On: 05/11/2019 10:31   Dg Chest Portable 1 View  Result Date: 05/10/2019 CLINICAL DATA:  Syncope. EXAM: PORTABLE CHEST 1 VIEW COMPARISON:  None. FINDINGS: The heart size is mildly enlarged. There are aortic calcifications. There is no pneumothorax. No large pleural effusion. There are prominent interstitial lung markings. There is no large pleural effusion. IMPRESSION: 1. Mild cardiac enlargement. 2. Prominent interstitial lung markings favored to represent mild volume overload on a background of emphysematous changes. Electronically Signed   By: Katherine Mantlehristopher  Green M.D.   On: 05/10/2019 20:37       Today   Subjective:   Jenna Robertson patient doing better she denies any symptoms  Objective:   Blood pressure 123/65, pulse 68, temperature 98.3 F (36.8 C), temperature source Oral, resp. rate 20, height 5\' 1"  (1.549 m), weight 57.3 kg, SpO2 100 %.  .  Intake/Output Summary (Last 24 hours) at 05/11/2019 1429 Last data filed at 05/11/2019 1300 Gross per 24  hour  Intake 656.29 ml  Output 1600 ml  Net -943.71 ml    Exam VITAL SIGNS: Blood pressure 123/65, pulse 68, temperature 98.3 F (36.8 C), temperature source Oral, resp. rate 20, height 5\' 1"  (1.549 m), weight 57.3 kg, SpO2 100 %.  GENERAL:  73 y.o.-year-old patient lying in the bed with no acute distress.  EYES: Pupils equal, round, reactive to light and accommodation. No scleral icterus. Extraocular muscles intact.  HEENT: Head atraumatic, normocephalic. Oropharynx and nasopharynx clear.  NECK:  Supple, no jugular venous distention. No thyroid enlargement, no tenderness.  LUNGS: Normal breath sounds bilaterally, no wheezing, rales,rhonchi or crepitation. No use of accessory muscles of respiration.  CARDIOVASCULAR: S1, S2 normal. No murmurs, rubs, or gallops.  ABDOMEN: Soft, nontender, nondistended. Bowel sounds present. No organomegaly or mass.  EXTREMITIES: No pedal edema, cyanosis, or clubbing.  NEUROLOGIC: Cranial nerves II through XII are intact. Muscle strength 5/5 in all extremities. Sensation intact. Gait not checked.  PSYCHIATRIC: The patient is alert and oriented x 3.  SKIN: No obvious rash, lesion, or ulcer.   Data Review     CBC w Diff:  Lab Results  Component Value Date   WBC 5.0 05/11/2019   HGB 10.5 (L) 05/11/2019   HCT 31.3 (L) 05/11/2019   PLT 224 05/11/2019   CMP:  Lab Results  Component Value Date   NA 138 05/11/2019   K 3.9 05/11/2019   CL 105 05/11/2019   CO2 28 05/11/2019   BUN 9 05/11/2019   CREATININE 0.97 05/11/2019  .  Micro Results Recent Results (from the past 240 hour(s))  SARS Coronavirus 2 (CEPHEID - Performed in [facility] hospital lab), Hosp Order     Status: None   Collection Time: 05/10/19  8:53 PM   Specimen: Nasopharyngeal Swab  Result  Value Ref Range Status   SARS Coronavirus 2 NEGATIVE NEGATIVE Final    Comment: (NOTE) If result is NEGATIVE SARS-CoV-2 target nucleic acids are NOT DETECTED. The SARS-CoV-2 RNA is generally  detectable in upper and lower  respiratory specimens during the acute phase of infection. The lowest  concentration of SARS-CoV-2 viral copies this assay can detect is 250  copies / mL. A negative result does not preclude SARS-CoV-2 infection  and should not be used as the sole basis for treatment or other  patient management decisions.  A negative result may occur with  improper specimen collection / handling, submission of specimen other  than nasopharyngeal swab, presence of viral mutation(s) within the  areas targeted by this assay, and inadequate number of viral copies  (<250 copies / mL). A negative result must be combined with clinical  observations, patient history, and epidemiological information. If result is POSITIVE SARS-CoV-2 target nucleic acids are DETECTED. The SARS-CoV-2 RNA is generally detectable in upper and lower  respiratory specimens dur ing the acute phase of infection.  Positive  results are indicative of active infection with SARS-CoV-2.  Clinical  correlation with patient history and other diagnostic information is  necessary to determine patient infection status.  Positive results do  not rule out bacterial infection or co-infection with other viruses. If result is PRESUMPTIVE POSTIVE SARS-CoV-2 nucleic acids MAY BE PRESENT.   A presumptive positive result was obtained on the submitted specimen  and confirmed on repeat testing.  While 2019 novel coronavirus  (SARS-CoV-2) nucleic acids may be present in the submitted sample  additional confirmatory testing may be necessary for epidemiological  and / or clinical management purposes  to differentiate between  SARS-CoV-2 and other Sarbecovirus currently known to infect humans.  If clinically indicated additional testing with an alternate test  methodology [phone]([id number]) is advised. The SARS-CoV-2 RNA is generally  detectable in upper and lower respiratory sp ecimens during the acute  phase of infection. The  expected result is Negative. Fact Sheet for Patients:  BoilerBrush.com.cyhttps://www.fda.gov/media/136312/download Fact Sheet for Healthcare Providers: https://pope.com/https://www.fda.gov/media/136313/download This test is not yet approved or cleared by the [place] FDA and has been authorized for detection and/or diagnosis of SARS-CoV-2 by FDA under an Emergency Use Authorization (EUA).  This EUA will remain in effect (meaning this test can be used) for the duration of the COVID-19 declaration under Section 564(b)(1) of the Act, 21 U.S.C. section 360bbb-3(b)(1), unless the authorization is terminated or revoked sooner. Performed at [facility], [address] [place], [place] [address]         Code Status Orders  (From admission, onward)         Start     Ordered   05/10/19 2323  Full code  Continuous     05/10/19 2323        Code Status History    This patient has a current code status but no historical code status.   Advance Care Planning Activity            Discharge Medications   Allergies as of 05/11/2019      Reactions   Hydromorphone Itching, Hives   Contrast Media  [iodinated Diagnostic Agents] Rash, Hives   Iopamidol Rash, Hives      Medication List    STOP taking these medications   amLODipine 10 MG tablet Commonly known as: NORVASC   lisinopril 10 MG tablet Commonly known as: ZESTRIL     TAKE these medications   acetaminophen  500 MG tablet Commonly known as: TYLENOL Take 500 mg by mouth every 4 (four) hours as needed.   amitriptyline 75 MG tablet Commonly known as: ELAVIL Take 75 mg by mouth at bedtime. Notes to patient: 7/1   atorvastatin 40 MG tablet Commonly known as: LIPITOR Take 40 mg by mouth daily. Notes to patient: 6/30   clopidogrel 75 MG tablet Commonly known as: PLAVIX Take 75 mg by mouth daily. Notes to patient: 7/1   DIABETIC TUSSIN MAX ST PO Take 10 mLs by mouth every 4 (four) hours as needed.   famotidine 20 MG  tablet Commonly known as: PEPCID Take 20 mg by mouth 2 (two) times daily. Notes to patient: 6/30   gabapentin 100 MG capsule Commonly known as: NEURONTIN Take 200 mg by mouth 3 (three) times daily. Notes to patient: 6/30   hydrocortisone 2.5 % cream Apply 1 application topically at bedtime. to face   levothyroxine 50 MCG tablet Commonly known as: SYNTHROID Take 50 mcg by mouth every morning. Notes to patient: 7/1   lipase/protease/amylase 12000 units Cpep capsule Commonly known as: CREON Take 24,000 Units by mouth 3 (three) times daily before meals.   metFORMIN 1000 MG tablet Commonly known as: GLUCOPHAGE Take 1,000 mg by mouth 2 (two) times daily. with meals Notes to patient: 6/30   midodrine 5 MG tablet Commonly known as: PROAMATINE Take 1 tablet (5 mg total) by mouth 3 (three) times daily with meals. Notes to patient: 6/30   senna-docusate 8.6-50 MG tablet Commonly known as: Senokot-S Take 2 tablets by mouth 2 (two) times daily. Notes to patient: 6/30   vitamin B-12 1000 MCG tablet Commonly known as: CYANOCOBALAMIN Take 1,000 mcg by mouth daily. Notes to patient: 6/30          Total Time in preparing paper work, data evaluation and todays exam - 77 minutes  Dustin Flock M.D on 05/11/2019 at 2:29 PM [place]  [phone]

## 2019-05-11 NOTE — Consult Note (Signed)
[facility]  CARDIOLOGY CONSULT NOTE  Patient ID: Jenna PotterDoris Mahaffy MRN: [id number] DOB/AGE: 12/26/1945 73 y.o.  Admit date: 05/10/2019 Referring Physician Anne HahnWillis Primary Physician Genevie AnnJoseph, Joel, MD Primary Cardiologist None per patient Reason for Consultation Syncope   HPI: 73 year old female referred for evaluation of questionable recurrent syncope.  The patient has a history of type 2 diabetes, hypertension, OSA, chronic pancreatitis status post Whipple procedure, and peripheral artery disease, status post bilateral femoral endarterectomy and bilateral common iliac stent. She denies a known cardiac history. The patient reports a 2-3 year history of recurrent syncope, though she cannot recall if she ever actually loses consciousness. She states that she simply falls without warning and without vision changes, chest pain, shortness of breath, palpitations, lightheadedness or dizziness. The episodes typically occur while she is being active with activities such as cleaning, not occurring at rest. She denies positional dizziness or dizziness associated with head movements. She states that she suddenly finds herself falling and hitting the ground hard, though cannot recall prodromal symptoms or residual deficits. She has sought medical attention after each episode with primary care, but cannot recall a thorough work-up. The patient presented to [facility] ER for questionable syncope and collapse x 2 with laceration to left periorbital region. ECG revealed sinus tachycardia at a rate of 112 bpm with nonspecific ST-T wave abnormalities without ST elevation, largely unchanged from previous. Admission labs notable for troponin of 4 and 6, sodium 130 (now 138), hemoglobin 10.5, hematocrit 31.3. Head CT negative for acute abnormality. Chest CT negative for acute abnormality. 2D echocardiogram in 2011 revealed normal left ventricular function, with LVEF 55% with diastolic dysfunction, aortic valve sclerosis, moderate aortic  regurgitation, and mild tricuspid and pulmonic valve regurgitation. 2D echocardiogram during this admission reveals normal left ventricular function with LVEF 60-65% with mild to moderate aortic insufficiency and mild aortic stenosis. Carotid ultrasound revealed mild plaque bilaterally without hemodynamically significant stenosis. There have been no significant findings on telemetry so far. Currently, the patient reports feeling fine. She denies chest pain, shortness of breath, palpitations, dizziness, presyncope, or peripheral edema.     Review of systems complete and found to be negative unless listed above     Past Medical History:  Diagnosis Date  . Diabetes mellitus without complication (HCC)   . Hypertension     Past Surgical History:  Procedure Laterality Date  . CHOLECYSTECTOMY    . STENT PLACEMENT VASCULAR ([facility] HX)    . WHIPPLE PROCEDURE      Medications Prior to Admission  Medication Sig Dispense Refill Last Dose  . acetaminophen (TYLENOL) 500 MG tablet Take 500 mg by mouth every 4 (four) hours as needed.   PRN at PRN  . amitriptyline (ELAVIL) 75 MG tablet Take 75 mg by mouth at bedtime.   05/09/2019 at 2000  . amLODipine (NORVASC) 10 MG tablet Take 10 mg by mouth daily.   05/10/2019 at 1300  . atorvastatin (LIPITOR) 40 MG tablet Take 40 mg by mouth daily.   05/10/2019 at 1300  . clopidogrel (PLAVIX) 75 MG tablet Take 75 mg by mouth daily.   05/10/2019 at 1300  . Dextromethorphan-guaiFENesin (DIABETIC TUSSIN MAX ST PO) Take 10 mLs by mouth every 4 (four) hours as needed.   PRN at PRN  . famotidine (PEPCID) 20 MG tablet Take 20 mg by mouth 2 (two) times daily.   05/10/2019 at 1300  . gabapentin (NEURONTIN) 100 MG capsule Take 200 mg by mouth 3 (three) times daily.   05/10/2019 at 1100  .  hydrocortisone 2.5 % cream Apply 1 application topically at bedtime. to face   05/09/2019 at 2000  . levothyroxine (SYNTHROID) 50 MCG tablet Take 50 mcg by mouth every morning.   05/10/2019 at 1300  .  lipase/protease/amylase (CREON) 12000 units CPEP capsule Take 24,000 Units by mouth 3 (three) times daily before meals.   05/10/2019 at 1200  . metFORMIN (GLUCOPHAGE) 1000 MG tablet Take 1,000 mg by mouth 2 (two) times daily. with meals   05/10/2019 at 1100  . senna-docusate (SENOKOT-S) 8.6-50 MG tablet Take 2 tablets by mouth 2 (two) times daily.   PRN at PRN  . vitamin B-12 (CYANOCOBALAMIN) 1000 MCG tablet Take 1,000 mcg by mouth daily.   05/10/2019 at 1300  . lisinopril (ZESTRIL) 10 MG tablet Take 10 mg by mouth daily.   Not Taking at Unknown time   Social History   Socioeconomic History  . Marital status: Married    Spouse name: Not on file  . Number of children: Not on file  . Years of education: Not on file  . Highest education level: Not on file  Occupational History  . Not on file  Social Needs  . Financial resource strain: Not on file  . Food insecurity    Worry: Not on file    Inability: Not on file  . Transportation needs    Medical: Not on file    Non-medical: Not on file  Tobacco Use  . Smoking status: Former Research scientist (life sciences)  . Smokeless tobacco: Former Network engineer and Sexual Activity  . Alcohol use: Not Currently  . Drug use: Not Currently  . Sexual activity: Not on file  Lifestyle  . Physical activity    Days per week: Not on file    Minutes per session: Not on file  . Stress: Not on file  Relationships  . Social Herbalist on phone: Not on file    Gets together: Not on file    Attends religious service: Not on file    Active member of club or organization: Not on file    Attends meetings of clubs or organizations: Not on file    Relationship status: Not on file  . Intimate partner violence    Fear of current or ex partner: Not on file    Emotionally abused: Not on file    Physically abused: Not on file    Forced sexual activity: Not on file  Other Topics Concern  . Not on file  Social History Narrative  . Not on file    Family History  Problem  Relation Age of Onset  . Diabetes Mother   . Hypertension Mother   . Hypertension Father   . Hypertension Brother   . Aneurysm Brother   . Aneurysm Sister       Review of systems complete and found to be negative unless listed above      PHYSICAL EXAM  General: Well developed, well nourished, in no acute distress HEENT:  Normocephalic and atramatic Neck:  No JVD.  Lungs: Clear bilaterally to auscultation, normal effort of breathing on room air. Heart: HRRR . Normal S1 and S2 without gallops or murmurs.  Abdomen: nondistended Msk:  Back normal, gait not assessed. No obvious deformity. Extremities: No clubbing, cyanosis or edema.   Neuro: Alert and oriented X 3. Psych:  Good affect, responds appropriately  Labs:   Lab Results  Component Value Date   WBC 5.0 05/11/2019   HGB 10.5 (L) 05/11/2019  HCT 31.3 (L) 05/11/2019   MCV 83.7 05/11/2019   PLT 224 05/11/2019    Recent Labs  Lab 05/11/19 0517  NA 138  K 3.9  CL 105  CO2 28  BUN 9  CREATININE 0.97  CALCIUM 8.6*  GLUCOSE 99   No results found for: CKTOTAL, CKMB, CKMBINDEX, TROPONINI No results found for: CHOL No results found for: HDL No results found for: LDLCALC No results found for: TRIG No results found for: CHOLHDL No results found for: LDLDIRECT    Radiology: Ct Head Wo Contrast  Result Date: 05/10/2019 CLINICAL DATA:  Head trauma EXAM: CT HEAD WITHOUT CONTRAST TECHNIQUE: Contiguous axial images were obtained from the base of the skull through the vertex without intravenous contrast. COMPARISON:  None. FINDINGS: Brain: No evidence of acute infarction, hemorrhage, hydrocephalus, extra-axial collection or mass lesion/mass effect. Mild periventricular white matter hypodensity. Vascular: No hyperdense vessel or unexpected calcification. Skull: Normal. Negative for fracture or focal lesion. Sinuses/Orbits: No acute finding. Other: None. IMPRESSION: No acute intracranial pathology.  Small-vessel white matter  disease. Electronically Signed   By: Lauralyn PrimesAlex  Bibbey M.D.   On: 05/10/2019 18:49   Ct Cervical Spine Wo Contrast  Result Date: 05/10/2019 CLINICAL DATA:  Fall.  Syncope. EXAM: CT CERVICAL SPINE WITHOUT CONTRAST TECHNIQUE: Multidetector CT imaging of the cervical spine was performed without intravenous contrast. Multiplanar CT image reconstructions were also generated. COMPARISON:  None. FINDINGS: Alignment: Mild anterolisthesis C7-T1 Skull base and vertebrae: Negative for cervical spine fracture Soft tissues and spinal canal: Negative for soft tissue mass or edema. Disc levels: Moderate disc degeneration and spurring throughout the cervical spine. Prominent right-sided osteophyte at C3-4 causing spinal stenosis. Mild spinal stenosis C4-5 and C5-6 and C6-7. Upper chest: Lung apices clear bilaterally Other: None IMPRESSION: Negative for cervical spine fracture. Moderate cervical spondylosis. Electronically Signed   By: Marlan Palauharles  Clark M.D.   On: 05/10/2019 20:54   Dg Chest Portable 1 View  Result Date: 05/10/2019 CLINICAL DATA:  Syncope. EXAM: PORTABLE CHEST 1 VIEW COMPARISON:  None. FINDINGS: The heart size is mildly enlarged. There are aortic calcifications. There is no pneumothorax. No large pleural effusion. There are prominent interstitial lung markings. There is no large pleural effusion. IMPRESSION: 1. Mild cardiac enlargement. 2. Prominent interstitial lung markings favored to represent mild volume overload on a background of emphysematous changes. Electronically Signed   By: Katherine Mantlehristopher  Green M.D.   On: 05/10/2019 20:37    EKG: sinus rhythm  ASSESSMENT AND PLAN:  1. Recurrent questionable syncope, unsure if patient actually loses consciousness, which has occurred about 8 times in the last 2-3 years with no known etiology. 2D echocardiogram reveals normal LV function with mild to moderate AR, mild AS. Chest xray reveals mild cardiac enlargement, prominent interstitial lung markings, possibly  representing mild volume overload, with emphysematous changes. Head CT and chest CT negative for acute abnormalities. Question drop attacks? 2. Hypertension, on amlodipine and lisinopril at home 3. Type II diabetes 4. Peripheral artery disease, status post bilateral femoral endarterectomy and bilateral common iliac stent. Home medications include atorvastatin and Plavix.  Recommendations: 1. Continue telemetry monitoring during admission, and recommend prolonged event recorder to be placed as outpatient. 2. Recommend continuing home Plavix and atorvastatin 3. Recommend continuing home lisinopril; add home amlodipine if needed 4. Recommend neurology consult as inpatient, or outpatient referral 5. Follow-up with Dr. Darrold JunkerParaschos as outpatient in 1 week  Signed: Leanora Ivanoffnna Varun Jourdan PA-C 05/11/2019, 9:25 AM

## 2020-06-02 IMAGING — CT CT CERVICAL SPINE WITHOUT CONTRAST
3 of 4 series · 11 of 33 positions shown, 13 images · non-contrast
Comparison: None.

CLINICAL DATA: Fall.  Syncope.

EXAM:
CT CERVICAL SPINE WITHOUT CONTRAST
TECHNIQUE: Multidetector CT imaging of the cervical spine was performed without
intravenous contrast. Multiplanar CT image reconstructions were also
generated.

[Series 6: sagittal bone · sagittal · 0.20mm/px · 5 of 56 slices shown, 6 images]
[im 19/56  bone]
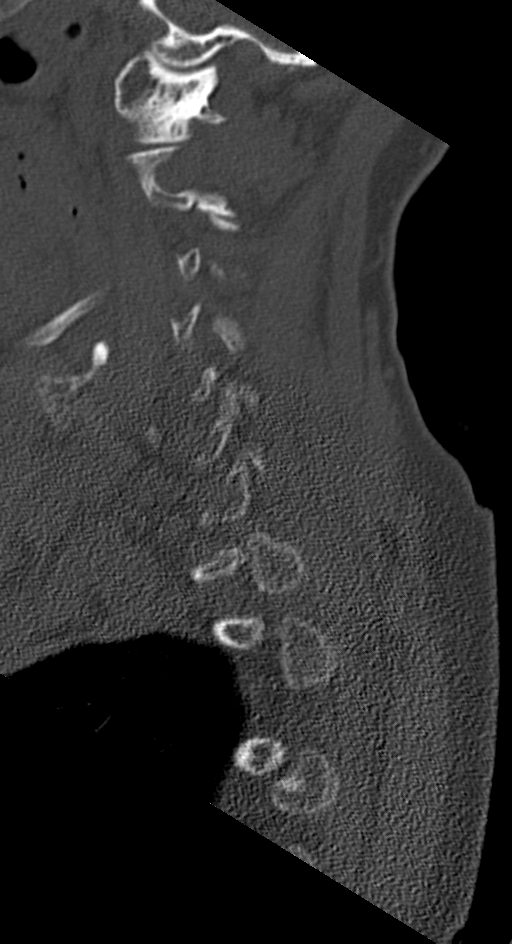
[im 23/56  bone]
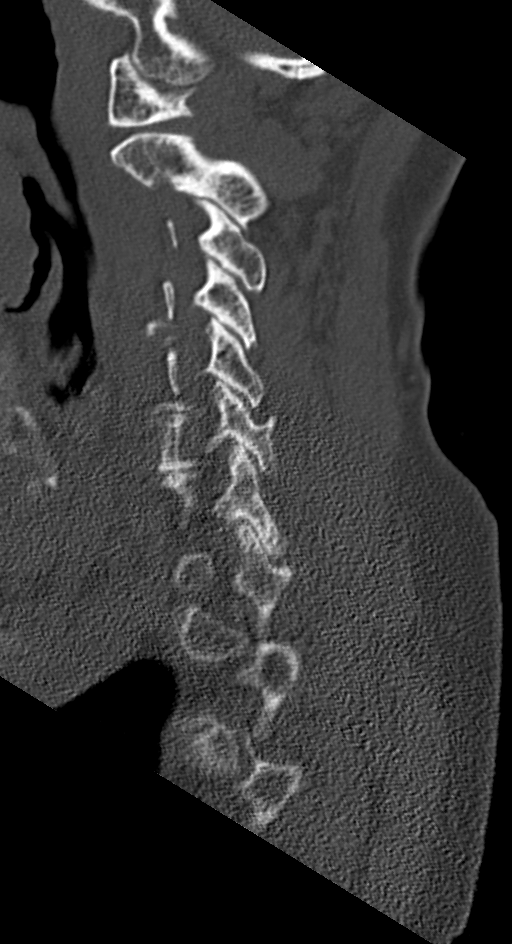
[im 28/56  soft-tissue]
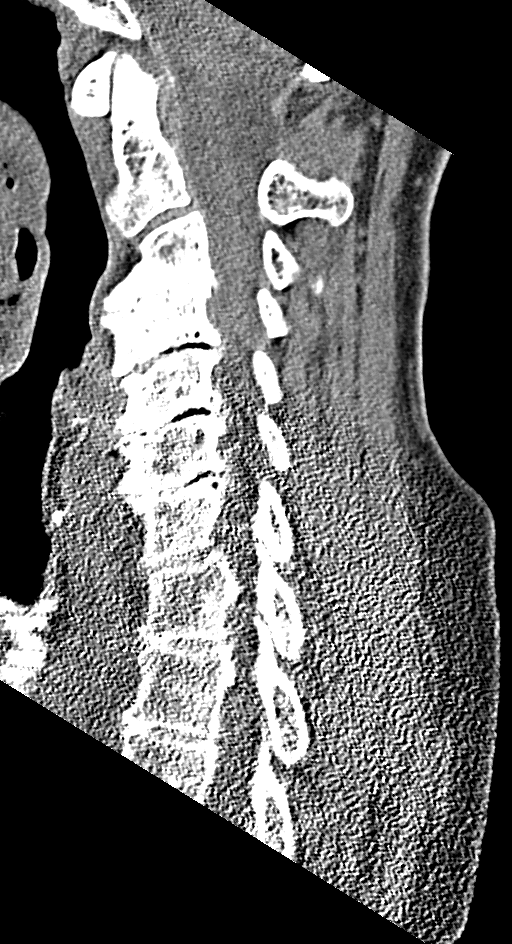
[im 28/56  bone]
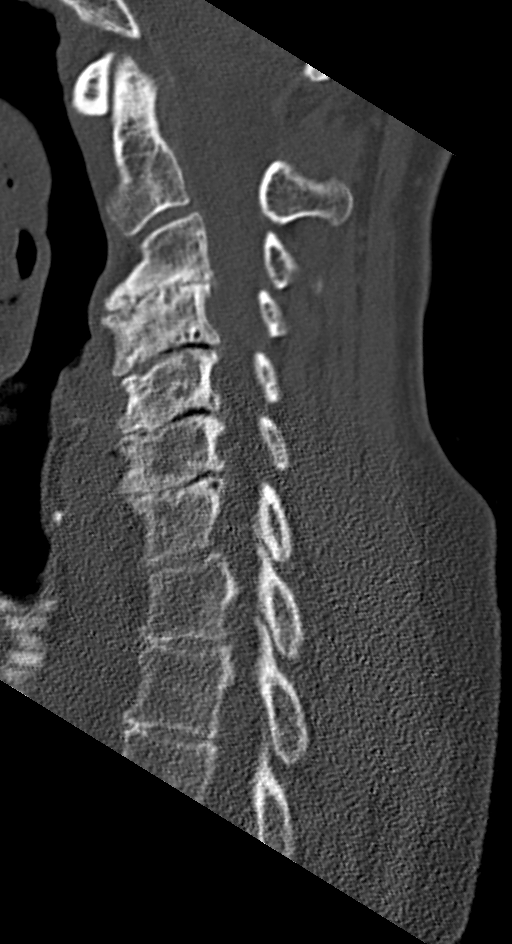
[im 33/56  bone]
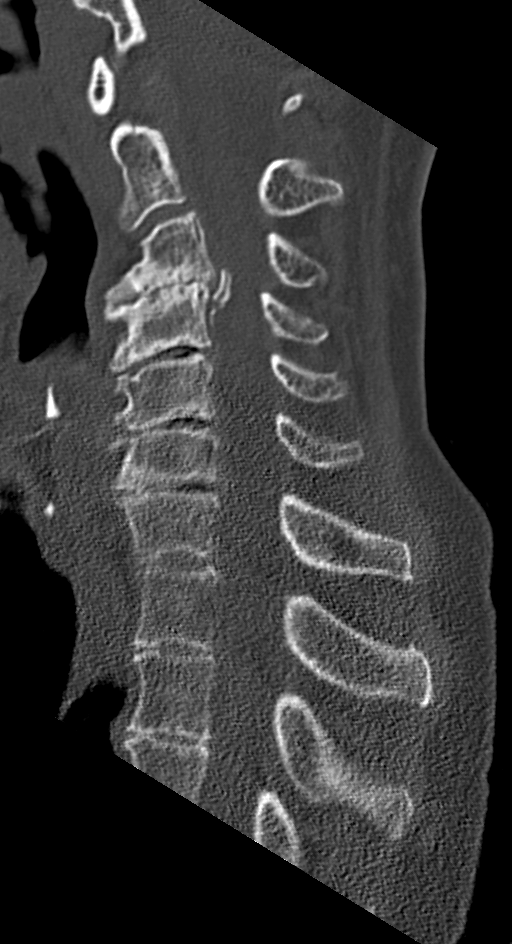
[im 37/56  bone]
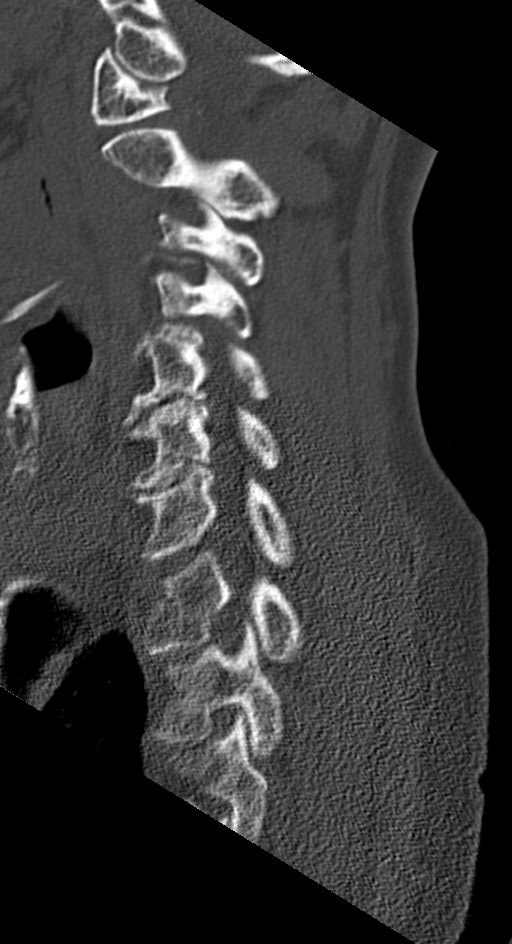

[Series 7: coronal bone · coronal · 0.21mm/px · 3 of 52 slices shown]
[im 12/52  bone]
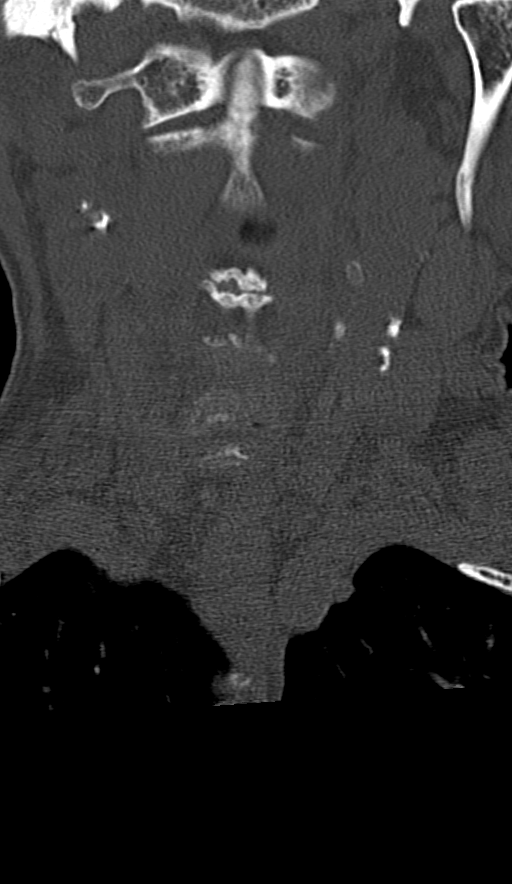
[im 21/52  bone]
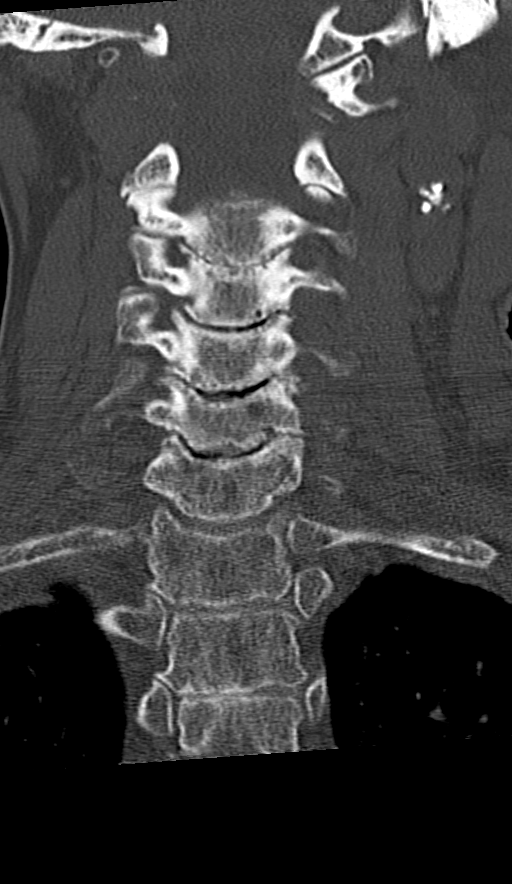
[im 31/52  bone]
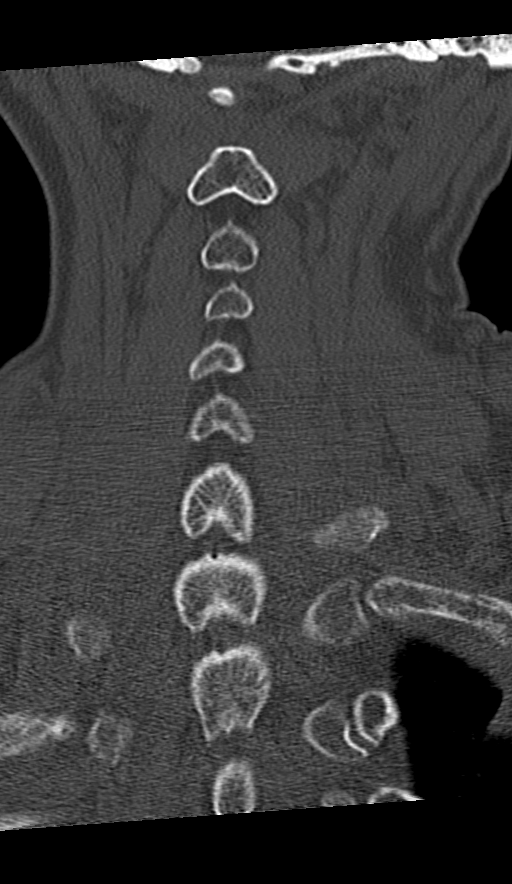

[Series 8: orthogonal bone · axial · 0.20mm/px · z∈[+211,+303]mm · 3 of 96 slices shown, 4 images]
[im 28/96  soft-tissue]
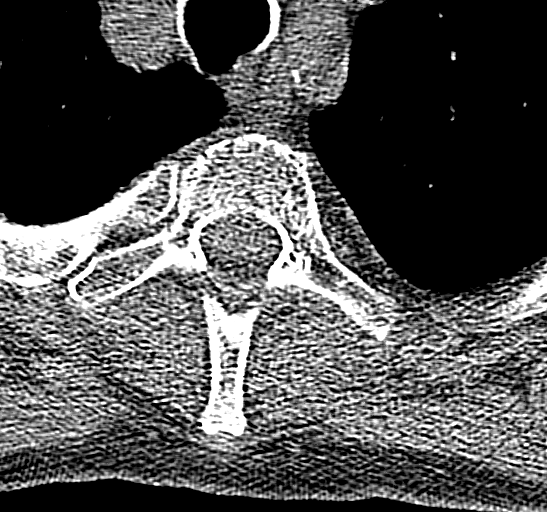
[im 28/96  bone]
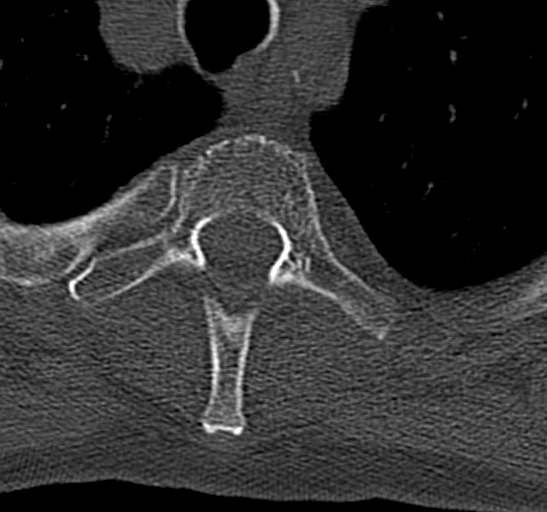
[im 55/96  bone]
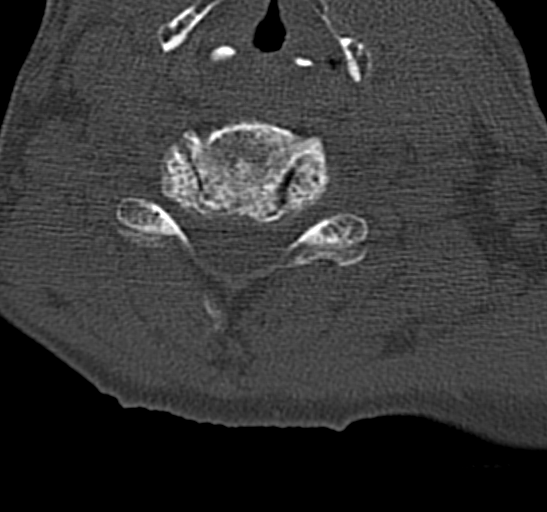
[im 82/96  bone]
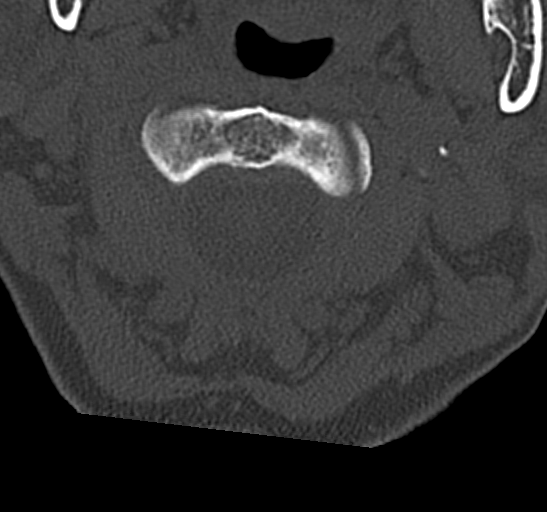

[11 of 33 positions shown; findings below may reference images not displayed]

FINDINGS: Alignment: Mild anterolisthesis C7-T1

Skull base and vertebrae: Negative for cervical spine fracture

Soft tissues and spinal canal: Negative for soft tissue mass or
edema.

Disc levels: Moderate disc degeneration and spurring throughout the
cervical spine. Prominent right-sided osteophyte at C3-4 causing
spinal stenosis. Mild spinal stenosis C4-5 and C5-6 and C6-7.

Upper chest: Lung apices clear bilaterally

Other: None
IMPRESSION: Negative for cervical spine fracture. Moderate cervical spondylosis.

## 2020-06-03 IMAGING — US BILATERAL CAROTID DUPLEX ULTRASOUND
1 series · 13 of 24 positions shown · non-contrast
Comparison: None.

CLINICAL DATA: 72-year-old female with syncope

EXAM:
BILATERAL CAROTID DUPLEX ULTRASOUND
TECHNIQUE: Gray scale imaging, color Doppler and duplex ultrasound were
performed of bilateral carotid and vertebral arteries in the neck.

[Series 1: bilateral carotid duplex ultrasound · 0.06mm/px · 13 of 69 slices shown]
[im 1/69]
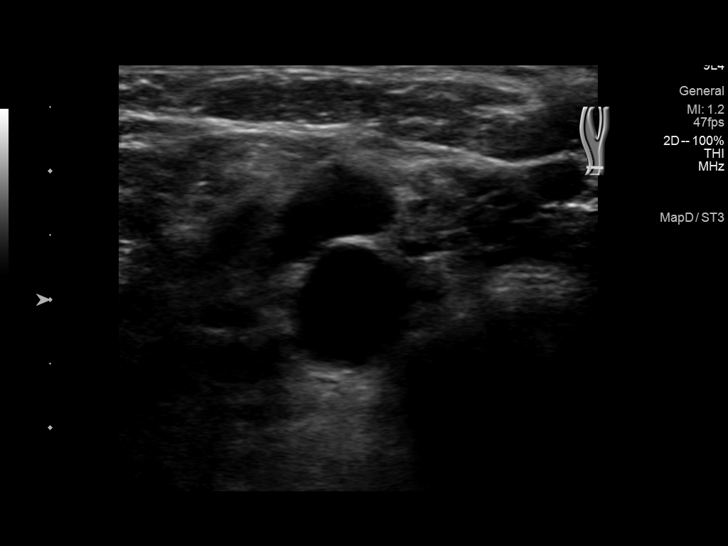
[im 6/69]
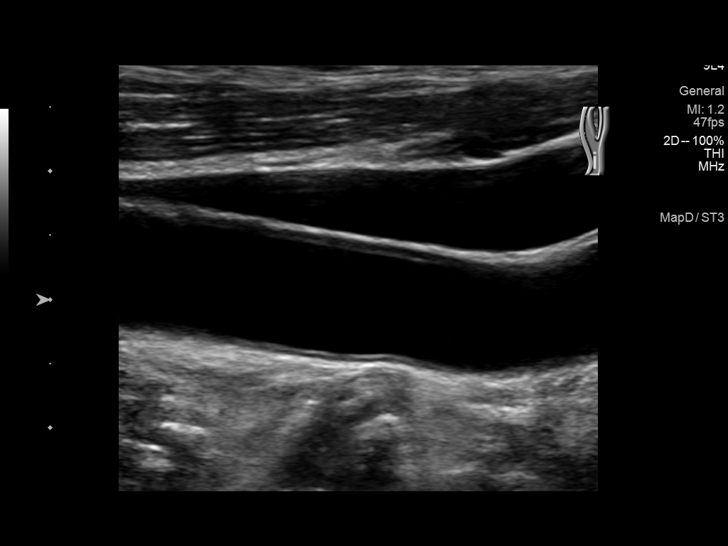
[im 12/69]
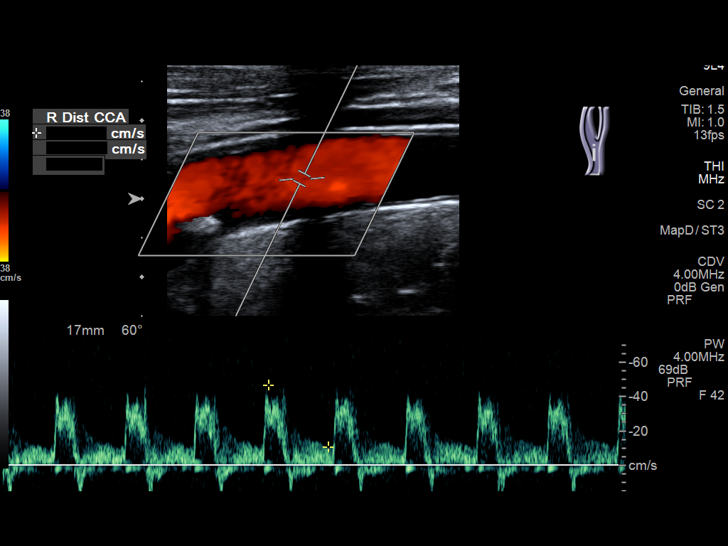
[im 18/69]
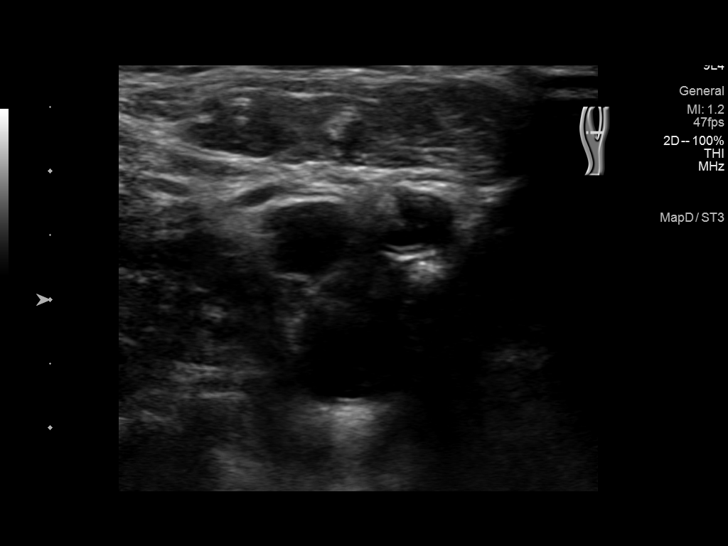
[im 24/69]
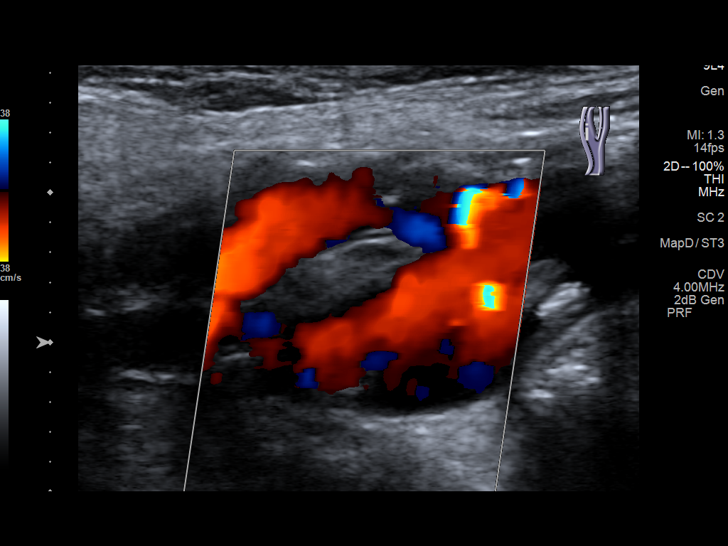
[im 30/69]
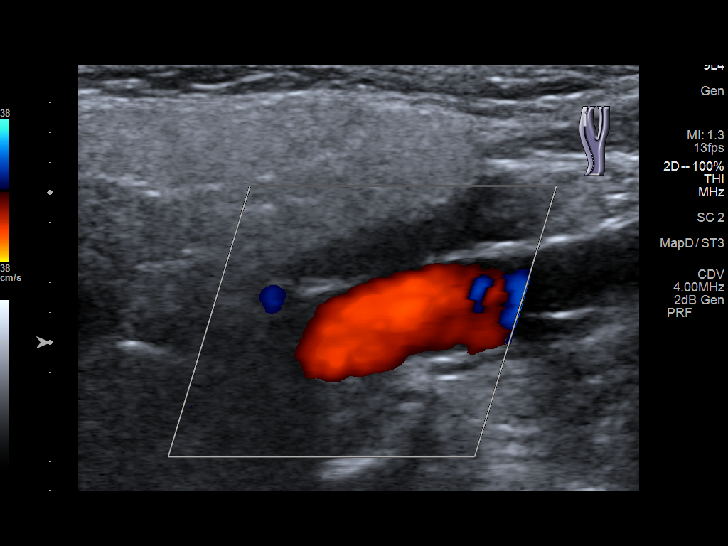
[im 36/69]
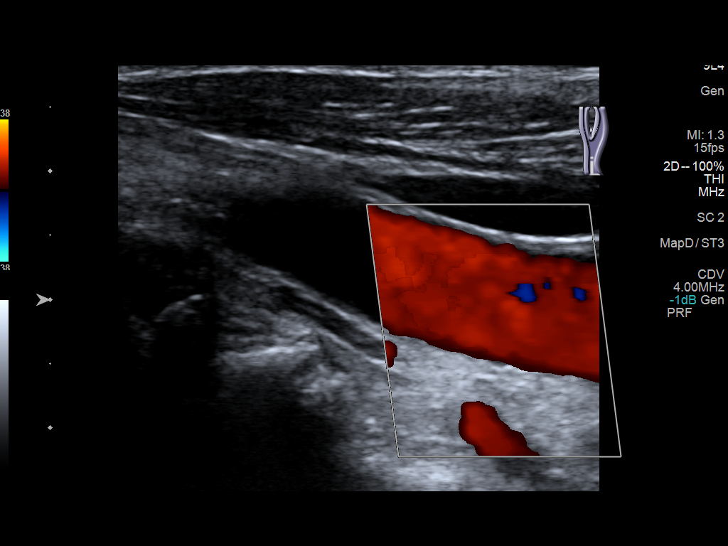
[im 39/69]
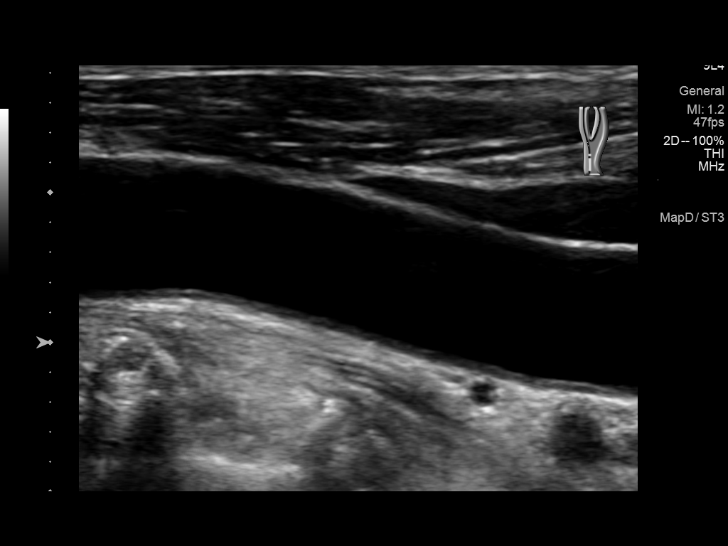
[im 45/69]
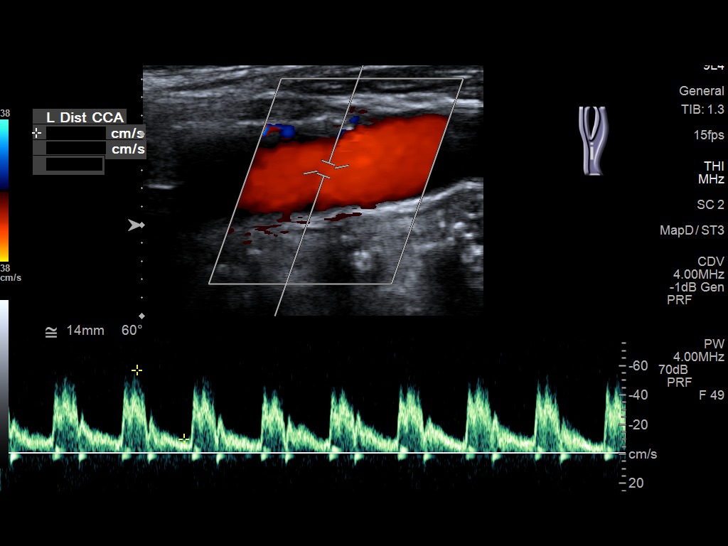
[im 51/69]
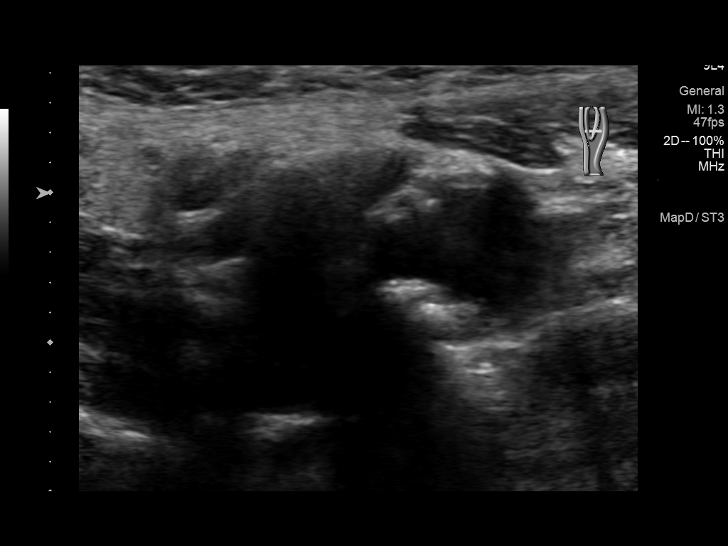
[im 57/69]
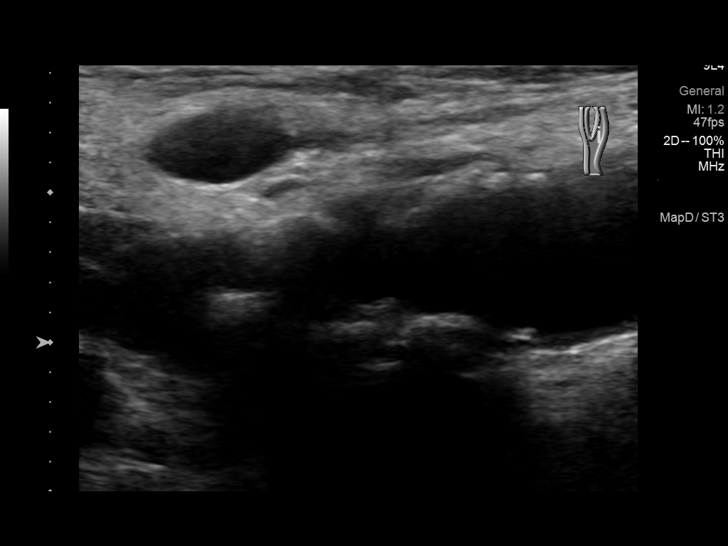
[im 63/69]
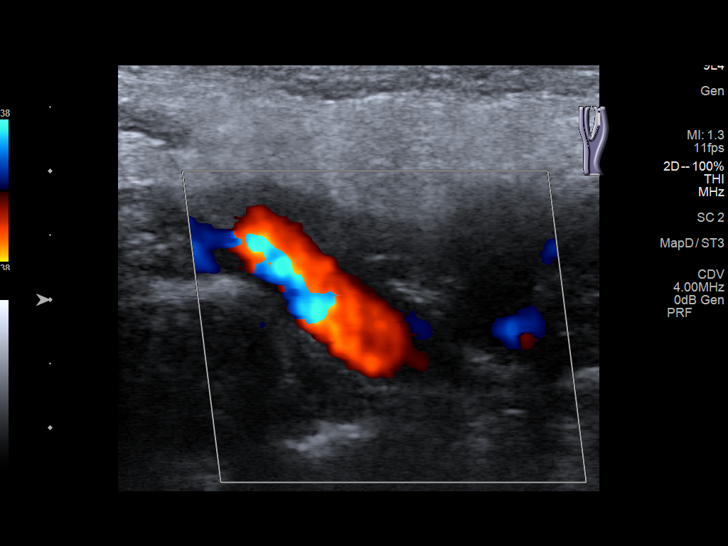
[im 69/69]
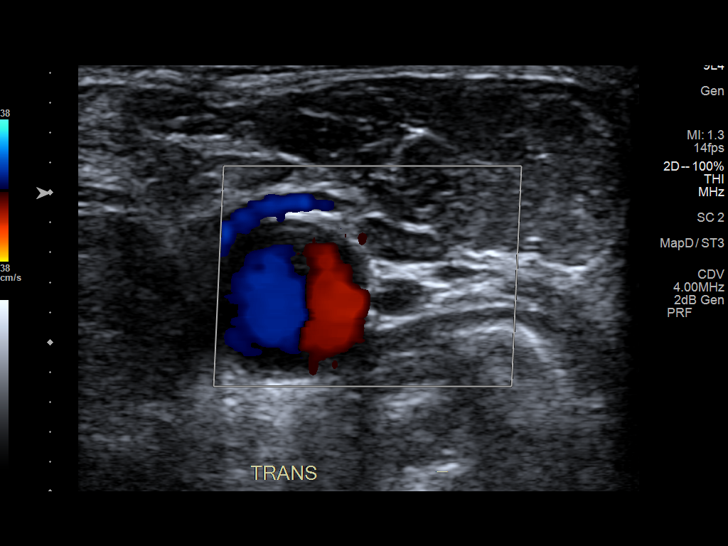

[13 of 24 positions shown; findings below may reference images not displayed]

FINDINGS: Criteria: Quantification of carotid stenosis is based on velocity
parameters that correlate the residual internal carotid diameter
with NASCET-based stenosis levels, using the diameter of the distal
internal carotid lumen as the denominator for stenosis measurement.

The following velocity measurements were obtained:

RIGHT
ICA: 70/17 cm/sec
CCA: 70/11 cm/sec

SYSTOLIC ICA/CCA RATIO:

ECA:  77 cm/sec

LEFT

ICA: 102/24 cm/sec

CCA: 68/11 cm/sec

SYSTOLIC ICA/CCA RATIO:

ECA:  76 cm/sec

RIGHT CAROTID ARTERY: Heterogeneous atherosclerotic plaque in the
carotid bifurcation and proximal internal carotid artery. By peak
systolic velocity criteria, the estimated stenosis remains less than
50%.

RIGHT VERTEBRAL ARTERY:  Patent with normal antegrade flow.

LEFT CAROTID ARTERY: Heterogeneous atherosclerotic plaque in the
proximal internal carotid artery. Proximal and mid internal carotid
artery are also highly tortuous. By peak systolic velocity criteria,
the estimated stenosis remains less than 50%.

LEFT VERTEBRAL ARTERY:  Patent with normal antegrade flow.
IMPRESSION: 1. Mild (1-49%) stenosis proximal right internal carotid artery
secondary to heterogenous atherosclerotic plaque.
2. Mild (1-49%) stenosis proximal left internal carotid artery
secondary to heterogenous atherosclerotic plaque.
3. The vertebral arteries are patent with normal antegrade flow.
